# Patient Record
Sex: Female | Born: 1955 | Race: White | Hispanic: No | Marital: Married | Smoking: Never smoker
Health system: Southern US, Community
[De-identification: ages and names within clinical notes are randomized; demographics above are authoritative.]

## PROBLEM LIST (undated history)

## (undated) DIAGNOSIS — K8010 Calculus of gallbladder with chronic cholecystitis without obstruction: Secondary | ICD-10-CM

## (undated) DIAGNOSIS — M1990 Unspecified osteoarthritis, unspecified site: Secondary | ICD-10-CM

## (undated) DIAGNOSIS — Z973 Presence of spectacles and contact lenses: Secondary | ICD-10-CM

## (undated) DIAGNOSIS — F419 Anxiety disorder, unspecified: Secondary | ICD-10-CM

## (undated) DIAGNOSIS — K219 Gastro-esophageal reflux disease without esophagitis: Secondary | ICD-10-CM

## (undated) DIAGNOSIS — C801 Malignant (primary) neoplasm, unspecified: Secondary | ICD-10-CM

## (undated) DIAGNOSIS — E049 Nontoxic goiter, unspecified: Secondary | ICD-10-CM

## (undated) DIAGNOSIS — M810 Age-related osteoporosis without current pathological fracture: Secondary | ICD-10-CM

## (undated) DIAGNOSIS — E039 Hypothyroidism, unspecified: Secondary | ICD-10-CM

## (undated) HISTORY — PX: BREAST EXCISIONAL BIOPSY: SUR124

---
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
## 1983-09-21 HISTORY — PX: ABDOMINAL HYSTERECTOMY: SHX81

## 1983-09-21 HISTORY — PX: APPENDECTOMY: SHX54

## 1998-02-16 ENCOUNTER — Ambulatory Visit (HOSPITAL_COMMUNITY): Admission: RE | Admit: 1998-02-16 | Discharge: 1998-02-16 | Payer: Self-pay | Admitting: Family Medicine

## 1998-02-21 ENCOUNTER — Ambulatory Visit (HOSPITAL_COMMUNITY): Admission: RE | Admit: 1998-02-21 | Discharge: 1998-02-21 | Payer: Self-pay | Admitting: Family Medicine

## 1998-09-20 HISTORY — PX: OTHER SURGICAL HISTORY: SHX169

## 2000-08-29 ENCOUNTER — Encounter: Payer: Self-pay | Admitting: Family Medicine

## 2000-08-29 ENCOUNTER — Encounter: Admission: RE | Admit: 2000-08-29 | Discharge: 2000-08-29 | Payer: Self-pay | Admitting: Family Medicine

## 2001-02-24 ENCOUNTER — Encounter: Payer: Self-pay | Admitting: Family Medicine

## 2001-02-24 ENCOUNTER — Encounter: Admission: RE | Admit: 2001-02-24 | Discharge: 2001-02-24 | Payer: Self-pay

## 2001-08-10 ENCOUNTER — Other Ambulatory Visit: Admission: RE | Admit: 2001-08-10 | Discharge: 2001-08-10 | Payer: Self-pay | Admitting: Family Medicine

## 2001-08-31 ENCOUNTER — Encounter: Admission: RE | Admit: 2001-08-31 | Discharge: 2001-08-31 | Payer: Self-pay | Admitting: Family Medicine

## 2001-08-31 ENCOUNTER — Encounter: Payer: Self-pay | Admitting: Family Medicine

## 2002-03-12 ENCOUNTER — Other Ambulatory Visit: Admission: RE | Admit: 2002-03-12 | Discharge: 2002-03-12 | Payer: Self-pay | Admitting: Family Medicine

## 2002-03-12 ENCOUNTER — Encounter (INDEPENDENT_AMBULATORY_CARE_PROVIDER_SITE_OTHER): Payer: Self-pay | Admitting: Specialist

## 2002-03-12 ENCOUNTER — Encounter: Admission: RE | Admit: 2002-03-12 | Discharge: 2002-03-12 | Payer: Self-pay | Admitting: Family Medicine

## 2002-03-12 ENCOUNTER — Encounter: Payer: Self-pay | Admitting: Family Medicine

## 2002-03-15 HISTORY — PX: BREAST CYST ASPIRATION: SHX578

## 2002-08-03 ENCOUNTER — Encounter: Admission: RE | Admit: 2002-08-03 | Discharge: 2002-08-03 | Payer: Self-pay | Admitting: Family Medicine

## 2002-08-03 ENCOUNTER — Encounter: Payer: Self-pay | Admitting: Family Medicine

## 2003-08-29 ENCOUNTER — Encounter: Admission: RE | Admit: 2003-08-29 | Discharge: 2003-08-29 | Payer: Self-pay | Admitting: Family Medicine

## 2004-04-10 ENCOUNTER — Other Ambulatory Visit: Admission: RE | Admit: 2004-04-10 | Discharge: 2004-04-10 | Payer: Self-pay | Admitting: Family Medicine

## 2004-04-24 ENCOUNTER — Ambulatory Visit (HOSPITAL_COMMUNITY): Admission: RE | Admit: 2004-04-24 | Discharge: 2004-04-24 | Payer: Self-pay | Admitting: *Deleted

## 2004-08-31 ENCOUNTER — Encounter: Admission: RE | Admit: 2004-08-31 | Discharge: 2004-08-31 | Payer: Self-pay | Admitting: Family Medicine

## 2005-06-11 ENCOUNTER — Other Ambulatory Visit: Admission: RE | Admit: 2005-06-11 | Discharge: 2005-06-11 | Payer: Self-pay | Admitting: Otolaryngology

## 2005-06-16 ENCOUNTER — Encounter: Admission: RE | Admit: 2005-06-16 | Discharge: 2005-06-16 | Payer: Self-pay | Admitting: Otolaryngology

## 2005-10-04 ENCOUNTER — Encounter: Admission: RE | Admit: 2005-10-04 | Discharge: 2005-10-04 | Payer: Self-pay | Admitting: Family Medicine

## 2006-10-10 ENCOUNTER — Encounter: Admission: RE | Admit: 2006-10-10 | Discharge: 2006-10-10 | Payer: Self-pay | Admitting: Family Medicine

## 2007-11-24 ENCOUNTER — Encounter: Admission: RE | Admit: 2007-11-24 | Discharge: 2007-11-24 | Payer: Self-pay | Admitting: Family Medicine

## 2008-11-25 ENCOUNTER — Encounter: Admission: RE | Admit: 2008-11-25 | Discharge: 2008-11-25 | Payer: Self-pay | Admitting: Family Medicine

## 2009-11-28 ENCOUNTER — Encounter: Admission: RE | Admit: 2009-11-28 | Discharge: 2009-11-28 | Payer: Self-pay | Admitting: Family Medicine

## 2010-10-23 ENCOUNTER — Other Ambulatory Visit: Payer: Self-pay | Admitting: Family Medicine

## 2010-10-23 DIAGNOSIS — M8580 Other specified disorders of bone density and structure, unspecified site: Secondary | ICD-10-CM

## 2010-12-01 ENCOUNTER — Ambulatory Visit
Admission: RE | Admit: 2010-12-01 | Discharge: 2010-12-01 | Disposition: A | Discharge status: Home / Self Care | Payer: 59 | Source: Ambulatory Visit | Attending: Family Medicine | Admitting: Family Medicine

## 2010-12-01 DIAGNOSIS — M8580 Other specified disorders of bone density and structure, unspecified site: Secondary | ICD-10-CM

## 2011-11-19 ENCOUNTER — Other Ambulatory Visit: Payer: Self-pay | Admitting: Family Medicine

## 2011-11-19 DIAGNOSIS — Z1231 Encounter for screening mammogram for malignant neoplasm of breast: Secondary | ICD-10-CM

## 2011-12-16 ENCOUNTER — Ambulatory Visit
Admission: RE | Admit: 2011-12-16 | Discharge: 2011-12-16 | Disposition: A | Discharge status: Home / Self Care | Payer: 59 | Source: Ambulatory Visit | Attending: Family Medicine | Admitting: Family Medicine

## 2011-12-16 DIAGNOSIS — Z1231 Encounter for screening mammogram for malignant neoplasm of breast: Secondary | ICD-10-CM

## 2012-09-04 ENCOUNTER — Other Ambulatory Visit: Payer: Self-pay | Admitting: Family Medicine

## 2012-09-04 DIAGNOSIS — Z1231 Encounter for screening mammogram for malignant neoplasm of breast: Secondary | ICD-10-CM

## 2012-09-04 DIAGNOSIS — M8580 Other specified disorders of bone density and structure, unspecified site: Secondary | ICD-10-CM

## 2012-12-22 ENCOUNTER — Ambulatory Visit
Admission: RE | Admit: 2012-12-22 | Discharge: 2012-12-22 | Disposition: A | Discharge status: Home / Self Care | Payer: PRIVATE HEALTH INSURANCE | Source: Ambulatory Visit | Attending: Family Medicine | Admitting: Family Medicine

## 2012-12-22 DIAGNOSIS — Z1231 Encounter for screening mammogram for malignant neoplasm of breast: Secondary | ICD-10-CM

## 2012-12-22 DIAGNOSIS — M8580 Other specified disorders of bone density and structure, unspecified site: Secondary | ICD-10-CM

## 2013-11-21 ENCOUNTER — Other Ambulatory Visit: Payer: Self-pay

## 2013-11-21 DIAGNOSIS — Z1231 Encounter for screening mammogram for malignant neoplasm of breast: Secondary | ICD-10-CM

## 2013-12-28 ENCOUNTER — Ambulatory Visit
Admission: RE | Admit: 2013-12-28 | Discharge: 2013-12-28 | Disposition: A | Discharge status: Home / Self Care | Payer: PRIVATE HEALTH INSURANCE | Source: Ambulatory Visit

## 2013-12-28 DIAGNOSIS — Z1231 Encounter for screening mammogram for malignant neoplasm of breast: Secondary | ICD-10-CM

## 2014-09-25 ENCOUNTER — Other Ambulatory Visit: Payer: Self-pay | Admitting: Family Medicine

## 2014-09-25 DIAGNOSIS — M8580 Other specified disorders of bone density and structure, unspecified site: Secondary | ICD-10-CM

## 2014-10-02 ENCOUNTER — Other Ambulatory Visit: Payer: Self-pay

## 2014-10-02 DIAGNOSIS — Z1231 Encounter for screening mammogram for malignant neoplasm of breast: Secondary | ICD-10-CM

## 2014-12-31 ENCOUNTER — Ambulatory Visit
Admission: RE | Admit: 2014-12-31 | Discharge: 2014-12-31 | Disposition: A | Discharge status: Home / Self Care | Payer: BLUE CROSS/BLUE SHIELD | Source: Ambulatory Visit

## 2014-12-31 ENCOUNTER — Ambulatory Visit
Admission: RE | Admit: 2014-12-31 | Discharge: 2014-12-31 | Disposition: A | Discharge status: Home / Self Care | Payer: BLUE CROSS/BLUE SHIELD | Source: Ambulatory Visit | Attending: Family Medicine | Admitting: Family Medicine

## 2014-12-31 DIAGNOSIS — M8580 Other specified disorders of bone density and structure, unspecified site: Secondary | ICD-10-CM

## 2014-12-31 DIAGNOSIS — Z1231 Encounter for screening mammogram for malignant neoplasm of breast: Secondary | ICD-10-CM

## 2015-06-25 ENCOUNTER — Ambulatory Visit: Payer: BLUE CROSS/BLUE SHIELD | Admitting: Physical Therapy

## 2016-01-05 ENCOUNTER — Other Ambulatory Visit: Payer: Self-pay

## 2016-01-05 DIAGNOSIS — Z1231 Encounter for screening mammogram for malignant neoplasm of breast: Secondary | ICD-10-CM

## 2016-01-20 ENCOUNTER — Ambulatory Visit
Admission: RE | Admit: 2016-01-20 | Discharge: 2016-01-20 | Disposition: A | Discharge status: Home / Self Care | Payer: BLUE CROSS/BLUE SHIELD | Source: Ambulatory Visit

## 2016-01-20 DIAGNOSIS — Z1231 Encounter for screening mammogram for malignant neoplasm of breast: Secondary | ICD-10-CM

## 2016-10-21 ENCOUNTER — Other Ambulatory Visit: Payer: Self-pay | Admitting: Family Medicine

## 2016-10-21 DIAGNOSIS — M8580 Other specified disorders of bone density and structure, unspecified site: Secondary | ICD-10-CM

## 2016-11-03 ENCOUNTER — Other Ambulatory Visit: Payer: Self-pay | Admitting: Family Medicine

## 2016-11-03 DIAGNOSIS — Z1231 Encounter for screening mammogram for malignant neoplasm of breast: Secondary | ICD-10-CM

## 2017-01-20 ENCOUNTER — Ambulatory Visit
Admission: RE | Admit: 2017-01-20 | Discharge: 2017-01-20 | Disposition: A | Discharge status: Home / Self Care | Payer: PRIVATE HEALTH INSURANCE | Source: Ambulatory Visit | Attending: Family Medicine | Admitting: Family Medicine

## 2017-01-20 ENCOUNTER — Ambulatory Visit
Admission: RE | Admit: 2017-01-20 | Discharge: 2017-01-20 | Disposition: A | Discharge status: Home / Self Care | Payer: BLUE CROSS/BLUE SHIELD | Source: Ambulatory Visit | Attending: Family Medicine | Admitting: Family Medicine

## 2017-01-20 DIAGNOSIS — M8580 Other specified disorders of bone density and structure, unspecified site: Secondary | ICD-10-CM

## 2017-01-20 DIAGNOSIS — Z1231 Encounter for screening mammogram for malignant neoplasm of breast: Secondary | ICD-10-CM

## 2018-01-16 ENCOUNTER — Other Ambulatory Visit: Payer: Self-pay | Admitting: Family Medicine

## 2018-01-16 DIAGNOSIS — Z1231 Encounter for screening mammogram for malignant neoplasm of breast: Secondary | ICD-10-CM

## 2018-02-14 ENCOUNTER — Ambulatory Visit
Admission: RE | Admit: 2018-02-14 | Discharge: 2018-02-14 | Disposition: A | Discharge status: Home / Self Care | Payer: PRIVATE HEALTH INSURANCE | Source: Ambulatory Visit | Attending: Family Medicine | Admitting: Family Medicine

## 2018-02-14 DIAGNOSIS — Z1231 Encounter for screening mammogram for malignant neoplasm of breast: Secondary | ICD-10-CM

## 2018-10-27 DIAGNOSIS — H6990 Unspecified Eustachian tube disorder, unspecified ear: Secondary | ICD-10-CM | POA: Diagnosis not present

## 2018-10-27 DIAGNOSIS — Z131 Encounter for screening for diabetes mellitus: Secondary | ICD-10-CM | POA: Diagnosis not present

## 2018-10-27 DIAGNOSIS — M79646 Pain in unspecified finger(s): Secondary | ICD-10-CM | POA: Diagnosis not present

## 2018-10-27 DIAGNOSIS — Z0000 Encounter for general adult medical examination without abnormal findings: Secondary | ICD-10-CM | POA: Diagnosis not present

## 2018-10-27 DIAGNOSIS — Z1211 Encounter for screening for malignant neoplasm of colon: Secondary | ICD-10-CM | POA: Diagnosis not present

## 2018-10-27 DIAGNOSIS — D485 Neoplasm of uncertain behavior of skin: Secondary | ICD-10-CM | POA: Diagnosis not present

## 2018-10-27 DIAGNOSIS — M810 Age-related osteoporosis without current pathological fracture: Secondary | ICD-10-CM | POA: Diagnosis not present

## 2018-10-27 DIAGNOSIS — F3340 Major depressive disorder, recurrent, in remission, unspecified: Secondary | ICD-10-CM | POA: Diagnosis not present

## 2018-10-27 DIAGNOSIS — K449 Diaphragmatic hernia without obstruction or gangrene: Secondary | ICD-10-CM | POA: Diagnosis not present

## 2018-10-27 DIAGNOSIS — E039 Hypothyroidism, unspecified: Secondary | ICD-10-CM | POA: Diagnosis not present

## 2018-11-03 DIAGNOSIS — Z1211 Encounter for screening for malignant neoplasm of colon: Secondary | ICD-10-CM | POA: Diagnosis not present

## 2018-11-03 DIAGNOSIS — M810 Age-related osteoporosis without current pathological fracture: Secondary | ICD-10-CM | POA: Diagnosis not present

## 2018-11-03 DIAGNOSIS — E039 Hypothyroidism, unspecified: Secondary | ICD-10-CM | POA: Diagnosis not present

## 2018-11-03 DIAGNOSIS — K219 Gastro-esophageal reflux disease without esophagitis: Secondary | ICD-10-CM | POA: Diagnosis not present

## 2018-11-03 DIAGNOSIS — Z0000 Encounter for general adult medical examination without abnormal findings: Secondary | ICD-10-CM | POA: Diagnosis not present

## 2018-11-03 DIAGNOSIS — E782 Mixed hyperlipidemia: Secondary | ICD-10-CM | POA: Diagnosis not present

## 2018-11-03 DIAGNOSIS — Z23 Encounter for immunization: Secondary | ICD-10-CM | POA: Diagnosis not present

## 2018-11-03 DIAGNOSIS — Z7189 Other specified counseling: Secondary | ICD-10-CM | POA: Diagnosis not present

## 2018-11-03 DIAGNOSIS — Z01419 Encounter for gynecological examination (general) (routine) without abnormal findings: Secondary | ICD-10-CM | POA: Diagnosis not present

## 2018-11-03 DIAGNOSIS — J309 Allergic rhinitis, unspecified: Secondary | ICD-10-CM | POA: Diagnosis not present

## 2018-11-03 DIAGNOSIS — F3340 Major depressive disorder, recurrent, in remission, unspecified: Secondary | ICD-10-CM | POA: Diagnosis not present

## 2018-11-10 ENCOUNTER — Other Ambulatory Visit: Payer: Self-pay | Admitting: Family Medicine

## 2018-11-10 DIAGNOSIS — M810 Age-related osteoporosis without current pathological fracture: Secondary | ICD-10-CM

## 2018-11-16 ENCOUNTER — Other Ambulatory Visit: Payer: Self-pay | Admitting: Family Medicine

## 2018-11-16 DIAGNOSIS — Z1231 Encounter for screening mammogram for malignant neoplasm of breast: Secondary | ICD-10-CM

## 2018-11-30 DIAGNOSIS — Z85828 Personal history of other malignant neoplasm of skin: Secondary | ICD-10-CM | POA: Diagnosis not present

## 2018-11-30 DIAGNOSIS — L910 Hypertrophic scar: Secondary | ICD-10-CM | POA: Diagnosis not present

## 2019-02-16 ENCOUNTER — Other Ambulatory Visit: Payer: PRIVATE HEALTH INSURANCE

## 2019-02-16 ENCOUNTER — Ambulatory Visit: Payer: PRIVATE HEALTH INSURANCE

## 2019-02-22 DIAGNOSIS — M810 Age-related osteoporosis without current pathological fracture: Secondary | ICD-10-CM | POA: Diagnosis not present

## 2019-03-21 HISTORY — PX: OTHER SURGICAL HISTORY: SHX169

## 2019-04-06 ENCOUNTER — Ambulatory Visit: Payer: PRIVATE HEALTH INSURANCE

## 2019-04-06 ENCOUNTER — Other Ambulatory Visit: Payer: PRIVATE HEALTH INSURANCE

## 2019-04-10 DIAGNOSIS — W541XXA Struck by dog, initial encounter: Secondary | ICD-10-CM | POA: Diagnosis not present

## 2019-04-10 DIAGNOSIS — S52541A Smith's fracture of right radius, initial encounter for closed fracture: Secondary | ICD-10-CM | POA: Diagnosis not present

## 2019-04-11 DIAGNOSIS — E039 Hypothyroidism, unspecified: Secondary | ICD-10-CM | POA: Diagnosis not present

## 2019-04-11 DIAGNOSIS — S52571A Other intraarticular fracture of lower end of right radius, initial encounter for closed fracture: Secondary | ICD-10-CM | POA: Diagnosis not present

## 2019-04-11 DIAGNOSIS — I509 Heart failure, unspecified: Secondary | ICD-10-CM | POA: Diagnosis not present

## 2019-04-11 DIAGNOSIS — M810 Age-related osteoporosis without current pathological fracture: Secondary | ICD-10-CM | POA: Diagnosis not present

## 2019-04-11 DIAGNOSIS — F329 Major depressive disorder, single episode, unspecified: Secondary | ICD-10-CM | POA: Diagnosis not present

## 2019-04-11 DIAGNOSIS — Z79899 Other long term (current) drug therapy: Secondary | ICD-10-CM | POA: Diagnosis not present

## 2019-04-11 DIAGNOSIS — F419 Anxiety disorder, unspecified: Secondary | ICD-10-CM | POA: Diagnosis not present

## 2019-04-11 DIAGNOSIS — G8918 Other acute postprocedural pain: Secondary | ICD-10-CM | POA: Diagnosis not present

## 2019-05-24 DIAGNOSIS — S52571D Other intraarticular fracture of lower end of right radius, subsequent encounter for closed fracture with routine healing: Secondary | ICD-10-CM | POA: Diagnosis not present

## 2019-06-04 ENCOUNTER — Other Ambulatory Visit: Payer: Self-pay

## 2019-06-04 ENCOUNTER — Ambulatory Visit: Payer: Medicare Other | Admitting: Physical Therapy

## 2019-06-04 ENCOUNTER — Encounter (HOSPITAL_COMMUNITY): Payer: Self-pay

## 2019-06-04 ENCOUNTER — Ambulatory Visit (HOSPITAL_COMMUNITY): Payer: Medicare Other | Attending: Family Medicine

## 2019-06-04 DIAGNOSIS — R278 Other lack of coordination: Secondary | ICD-10-CM | POA: Diagnosis not present

## 2019-06-04 DIAGNOSIS — M25531 Pain in right wrist: Secondary | ICD-10-CM | POA: Diagnosis not present

## 2019-06-04 DIAGNOSIS — R29898 Other symptoms and signs involving the musculoskeletal system: Secondary | ICD-10-CM

## 2019-06-04 DIAGNOSIS — R600 Localized edema: Secondary | ICD-10-CM | POA: Diagnosis not present

## 2019-06-04 DIAGNOSIS — M25631 Stiffness of right wrist, not elsewhere classified: Secondary | ICD-10-CM

## 2019-06-04 NOTE — Therapy (Signed)
[place], [place], [address] Phone: [phone]   Fax:  [phone]  Occupational Therapy Evaluation  Patient Details  Name: Madeline Horton MRN: [id number] Date of Birth: 01-03-1956 Referring Provider (OT): Les Pou, DO   Encounter Date: 06/04/2019  OT End of Session - 06/04/19 [id number]    Visit Number  1    Number of Visits  12    Date for OT Re-Evaluation  07/16/19    Authorization Type  1) [employer] 2) Eulis Foster    Authorization Time Period  progress note at visit 10.    Authorization - Visit Number  1    Authorization - Number of Visits  10    OT Start Time  [id number]    OT Stop Time  1620    OT Time Calculation (min)  48 min    Activity Tolerance  Patient tolerated treatment well    Behavior During Therapy  WFL for tasks assessed/performed       History reviewed. No pertinent past medical history.  Past Surgical History:  Procedure Laterality Date  . BREAST EXCISIONAL BIOPSY Left    benign    There were no vitals filed for this visit.  Subjective Assessment - 06/04/19 1540    Subjective   S: I can't do much of anything with this hand.    Pertinent History  Patient is a 63 y/o female S/P right wrist ORIF which was completed on 04/11/19 after sustaining a mechanical fall on 04/09/19. Patient reports that she was placed in a full cast for 2 weeks and then was placed in a short arm cast. Her cast was removed on 05/24/19. Dr. Roberto Scales has referred patient to occupational therapy for evaluation and treatment.    Special Tests  DASH    Patient Stated Goals  To be able to use my hand again.    Currently in Pain?  Yes    Pain Score  5     Pain Location  Wrist    Pain Orientation  Right    Pain Descriptors / Indicators  Burning;Throbbing    Pain Type  Acute pain    Pain Radiating Towards  Sometimes it moves up to the neck/shoulder    Pain Onset  More than a month ago    Pain Frequency   Intermittent    Aggravating Factors   movement and use    Pain Relieving Factors  Advil, simple gentle exercises, ice and heat    Effect of Pain on Daily Activities  severe effect        [facility] OT Assessment - 06/04/19 1542      Assessment   Medical Diagnosis  Right ORIF Wrist    Referring Provider (OT)  Les Pou, DO    Onset Date/Surgical Date  04/09/19   sugery: 04/11/19   Hand Dominance  Right    Next MD Visit  As needed.    Prior Therapy  None for this condition      Precautions   Precautions  Other (comment)    Precaution Comments  Progress as tolerated.       Restrictions   Weight Bearing Restrictions  No      Balance Screen   Has the patient fallen in the past 6 months  Yes    How many times?  1    Has the patient had a decrease in activity level because of a fear of falling?  No    Is the patient reluctant to leave their home because of a fear of falling?   No      Home  Environment   Family/patient expects to be discharged to:  Private residence      Prior Function   Level of [place]  Retired    Leisure  patient enjoys to sew.      ADL   ADL comments  Difficulty with any type of activity with the right hand. Patient reports she is able to use a fork now although it is difficult.       Mobility   Mobility Status  Independent      Written Expression   Dominant Hand  Right      Vision - History   Baseline Vision  Wears glasses all the time      Cognition   Overall Cognitive Status  Within Functional Limits for tasks assessed      Sensation   Stereognosis  Appears Intact    Hot/Cold  Appears Intact      Coordination   9 Hole Peg Test  Left;Right    Right 9 Hole Peg Test  24.9"    Left 9 Hole Peg Test  24.3"      Edema   Edema  Right wrist: 19 cm Left wrist: 17 cm. Right MCP joints: 19.5 cm Left MCP joints: 17.75 cm      ROM / Strength   AROM / PROM / Strength  AROM;PROM;Strength      Palpation   Palpation  comment  MIn fascial restriction along right wrist and forearm.       AROM   Overall AROM Comments  Assessed seated    AROM Assessment Site  Forearm;Wrist    Right/Left Forearm  Right    Right Forearm Pronation  90 Degrees    Right Forearm Supination  90 Degrees    Right/Left Wrist  Right    Right Wrist Extension  30 Degrees    Right Wrist Flexion  34 Degrees    Right Wrist Radial Deviation  20 Degrees    Right Wrist Ulnar Deviation  12 Degrees      PROM   PROM Assessment Site  Forearm;Wrist    Right/Left Forearm  Right    Right/Left Wrist  Right    Right Wrist Extension  48 Degrees    Right Wrist Flexion  40 Degrees    Right Wrist Radial Deviation  26 Degrees    Right Wrist Ulnar Deviation  20 Degrees      Strength   Strength Assessment Site  Wrist;Hand    Right/Left Wrist  Right    Right Wrist Flexion  3-/5    Right Wrist Extension  3-/5    Right Wrist Radial Deviation  3-/5    Right Wrist Ulnar Deviation  3-/5    Right/Left hand  Right;Left    Right Hand Grip (lbs)  6    Right Hand Lateral Pinch  16 lbs    Right Hand 3 Point Pinch  5 lbs    Left Hand Grip (lbs)  60    Left Hand Lateral Pinch  10 lbs    Left Hand 3 Point Pinch  11 lbs      Right Hand AROM   R Thumb MCP 0-60  26 Degrees    R Thumb IP 0-80  40 Degrees    R Index  MCP 0-90  50  Degrees    R Index PIP 0-100  82 Degrees    R Index DIP 0-70  42 Degrees    R Long  MCP 0-90  48 Degrees    R Long PIP 0-100  78 Degrees    R Long DIP 0-70  48 Degrees    R Ring  MCP 0-90  42 Degrees    R Ring PIP 0-100  80 Degrees    R Ring DIP 0-70  54 Degrees    R Little  MCP 0-90  0 Degrees    R Little PIP 0-100  50 Degrees    R Little DIP 0-70  48 Degrees          Quick Dash - 06/04/19 1547    Open a tight or new jar  Unable    Do heavy household chores (wash walls, wash floors)  Unable    Carry a shopping bag or briefcase  Unable    Wash your back  Unable    Use a knife to cut food  Unable    Recreational  activities in which you take some force or impact through your arm, shoulder, or hand (golf, hammering, tennis)  Unable    During the past week, to what extent has your arm, shoulder or hand problem interfered with your normal social activities with family, friends, neighbors, or groups?  Not at all    During the past week, to what extent has your arm, shoulder or hand problem limited your work or other regular daily activities  Extremely    Arm, shoulder, or hand pain.  Extreme    Tingling (pins and needles) in your arm, shoulder, or hand  Extreme    Difficulty Sleeping  Moderate difficulty    DASH Score  86.36 %                 OT Education - 06/04/19 1646    Education Details  wrist stretches, tendon gliding, towel squeeze and washcloth crumpling. Pt provided with right hand edema glove. Discussed wear schedule and cleaning.    Person(s) Educated  Patient    Methods  Explanation;Demonstration;Verbal cues;Handout    Comprehension  Verbalized understanding       OT Short Term Goals - 06/04/19 1651      OT SHORT TERM GOAL #1   Title  Patient will be educated and independent with HEP in order to faciliate her progress in therapy and allow her to return to using her RUE for her daily and leisure tasks.    Time  3    Period  Weeks    Status  New    Target Date  06/25/19      OT SHORT TERM GOAL #2   Title  Patient will demonstrate P/ROM WFL in order to begin using her Right hand as her dominant hand for 50% of daily tasks.    Time  3    Period  Weeks    Status  New        OT Long Term Goals - 06/04/19 1652      OT LONG TERM GOAL #1   Title  Patient will reach her highest level of independence with all daily and leisure tasks while using her RUE as her dominant extremity for 75% or more daily tasks.    Time  6    Period  Weeks    Status  New    Target Date  07/16/19  OT LONG TERM GOAL #2   Title  Pt will increase her Right wrist and hand A/ROM to [facility] in order to  return to her leisure tasks such as sewing.    Time  6    Period  Weeks      OT LONG TERM GOAL #3   Title  Patient will increase her right grip strength by 15# and her pinch strength by 8# in order to hold onto small items without dropping them.    Time  6    Period  Weeks    Status  New      OT LONG TERM GOAL #4   Title  Patient will increase her right wrist strength to 4/5 in order to complete basic daily tasks such as grooming, dressing, and bathing with decreased difficulty.    Time  6    Period  Weeks    Status  New      OT LONG TERM GOAL #5   Title  patient will increase her right hand coordination by completing the 9 hole peg test in 22 seconds or less.    Time  6    Period  Weeks    Status  New            Plan - 06/04/19 1648    Clinical Impression Statement  A: Patient is a5 y/o female S/P right wrist ORIF causing increased pain, fascial retrcitions, and decreased ROM, strength, and coordination resulting in difficulty completing any task while using her right hand as her dominant extremity.    OT Occupational Profile and History  Detailed Assessment- Review of Records and additional review of physical, cognitive, psychosocial history related to current functional performance    Occupational performance deficits (Please refer to evaluation for details):  ADL's;IADL's;Leisure    Body Structure / Function / Physical Skills  ADL;Dexterity;ROM;IADL;Sensation;Mobility;Fascial restriction;Coordination;GMC;UE functional use;Pain;FMC;Strength;Flexibility    Rehab Potential  Excellent    Clinical Decision Making  Several treatment options, min-mod task modification necessary    Comorbidities Affecting Occupational Performance:  None    Modification or Assistance to Complete Evaluation   Min-Moderate modification of tasks or assist with assess necessary to complete eval    OT Frequency  2x / week    OT Duration  6 weeks    OT Treatment/Interventions  Self-care/ADL  training;Therapeutic exercise;Splinting;Manual Therapy;Neuromuscular education;Ultrasound;Cryotherapy;Electrical Stimulation;Moist Heat;Passive range of motion;Contrast Bath;Patient/family education;Paraffin;DME and/or AE instruction;Therapeutic activities    Plan  P: patient will benefit from skilled OT services to increase functional performance and allow her to return to using her RUE as her dominant extremity. Treatment Plan: myofascial release, manual stretching, P/ROM, A/ROM, grip and pinch strengthening, edema management, wrist strengthening    Consulted and Agree with Plan of Care  Patient       Patient will benefit from skilled therapeutic intervention in order to improve the following deficits and impairments:   Body Structure / Function / Physical Skills: ADL, Dexterity, ROM, IADL, Sensation, Mobility, Fascial restriction, Coordination, GMC, UE functional use, Pain, FMC, Strength, Flexibility       Visit Diagnosis: Pain in right wrist  Stiffness of right wrist joint  Other symptoms and signs involving the musculoskeletal system  Localized edema  Other lack of coordination    Problem List There are no active problems to display for this patient.  Ailene Ravel, OTR/L,CBIS  [phone]  06/04/2019, 5:02 PM  [facility] [address] [place], [place], [address] Phone: [phone]  Fax:  [phone]  Name: RASHEENA TRETTIN MRN: [id number] Date of Birth: Apr 25, 1956

## 2019-06-04 NOTE — Patient Instructions (Signed)
Complete the following 3-5 times a day.   Wrist Flexion Stretch   With arm of affected side extended in front of body with palm facing down, let affected wrist drop downward. Using hand on unaffected side, apply gentle downward push on affected hand, stretching the affected wrist downward. Hold this position for 10-15 seconds then relax wrist. Complete 2 times.   Wrist Extensor Stretch. Hold for 10-15 seconds. Complete 2 times.      Tendon Gliding Exercises: Complete 10X, hold for 3-5 seconds, 1-2x per day  1) Straight: begin with wrist in extended position and fingers straight      2) Hook: Bend your fingers making them look like a hook while keeping your thumb straight.      3) Fist: Make your hand into a fist.      4) Table Top: Straighten your fingers straight out making them look like a table top.      5) Straight Fist: Bend your fingers straight down into a straight fist.     Paper Crumpling Exercise   Begin with right palm down on piece of paper. Maintaining contact between surface and heel of hand, crumple paper into a ball. Repeat _5-10___ times per set. Do ____ sets per session. Do ____ sessions per day.  Copyright  VHI. All rights reserved.     Towel Roll Squeeze   With right forearm resting on surface, gently squeeze towel. Repeat _5-10___ times per set. Do ____ sets per session. Do ____ sessions per day.  Copyright  VHI. All rights reserved.   Abduction / Adduction (Active)    With hand flat on table, spread all fingers apart, then bring them together as close as possible. Repeat _10___ times. Do ____ sessions per day.  Copyright  VHI. All rights reserved.  AROM: Thumb Abduction / Adduction   Actively pull right thumb away from palm as far as possible. Hold ____ seconds. Then bring thumb back to touch fingers. Try not to bend fingers toward thumb. Repeat _10___ times per set. Do ____ sets per session. Do ____ sessions per day.   Copyright  VHI. All rights reserved.

## 2019-06-05 ENCOUNTER — Ambulatory Visit
Admission: RE | Admit: 2019-06-05 | Discharge: 2019-06-05 | Disposition: A | Discharge status: Home / Self Care | Payer: Medicare Other | Source: Ambulatory Visit | Attending: Family Medicine | Admitting: Family Medicine

## 2019-06-05 DIAGNOSIS — Z1231 Encounter for screening mammogram for malignant neoplasm of breast: Secondary | ICD-10-CM | POA: Diagnosis not present

## 2019-06-05 DIAGNOSIS — M8589 Other specified disorders of bone density and structure, multiple sites: Secondary | ICD-10-CM | POA: Diagnosis not present

## 2019-06-05 DIAGNOSIS — M810 Age-related osteoporosis without current pathological fracture: Secondary | ICD-10-CM

## 2019-06-05 DIAGNOSIS — Z780 Asymptomatic menopausal state: Secondary | ICD-10-CM | POA: Diagnosis not present

## 2019-06-06 ENCOUNTER — Ambulatory Visit (HOSPITAL_COMMUNITY): Payer: Medicare Other

## 2019-06-06 ENCOUNTER — Encounter (HOSPITAL_COMMUNITY): Payer: Self-pay

## 2019-06-06 ENCOUNTER — Other Ambulatory Visit: Payer: Self-pay

## 2019-06-06 DIAGNOSIS — R29898 Other symptoms and signs involving the musculoskeletal system: Secondary | ICD-10-CM

## 2019-06-06 DIAGNOSIS — R278 Other lack of coordination: Secondary | ICD-10-CM

## 2019-06-06 DIAGNOSIS — M25631 Stiffness of right wrist, not elsewhere classified: Secondary | ICD-10-CM | POA: Diagnosis not present

## 2019-06-06 DIAGNOSIS — M25531 Pain in right wrist: Secondary | ICD-10-CM

## 2019-06-06 DIAGNOSIS — R600 Localized edema: Secondary | ICD-10-CM

## 2019-06-07 NOTE — Addendum Note (Signed)
Addended by: Ailene Ravel D on: 06/07/2019 10:09 AM   Modules accepted: Orders

## 2019-06-07 NOTE — Therapy (Signed)
[place], [place], [address] Phone: [phone]   Fax:  [phone]  Occupational Therapy Treatment  Patient Details  Name: Madeline Horton MRN: [id number] Date of Birth: 1956-05-03 Referring Provider (OT): Les Pou, DO   Encounter Date: 06/06/2019  OT End of Session - 06/07/19 0915    Visit Number  2    Number of Visits  12    Date for OT Re-Evaluation  07/16/19    Authorization Type  1) [employer] 2) [employer]ul[employer]    Authorization Time Period  progress note at visit 10.    Authorization - Visit Number  2    Authorization - Number of Visits  10    OT Start Time  1602    OT Stop Time  1638    OT Time Calculation (min)  36 min    Activity Tolerance  Patient tolerated treatment well    Behavior During Therapy  WFL for tasks assessed/performed       History reviewed. No pertinent past medical history.  Past Surgical History:  Procedure Laterality Date  . BREAST EXCISIONAL BIOPSY Left    benign    There were no vitals filed for this visit.  Subjective Assessment - 06/06/19 1609    Subjective   S: it's sore today    Currently in Pain?  Yes    Pain Score  7     Pain Location  Wrist   right small digit   Pain Orientation  Right    Pain Descriptors / Indicators  Sore    Pain Type  Acute pain         [facility] OT Assessment - 06/06/19 1611      Assessment   Medical Diagnosis  Right ORIF Wrist      Precautions   Precautions  Other (comment)    Precaution Comments  Progress as tolerated.                OT Treatments/Exercises (OP) - 06/06/19 1611      Exercises   Exercises  Wrist;Hand;Theraputty      Wrist Exercises   Wrist Flexion  PROM;AROM;10 reps    Wrist Extension  PROM;AROM;10 reps    Wrist Radial Deviation  PROM;5 reps    Wrist Ulnar Deviation  PROM;5 reps    Other wrist exercises  Pt utilized red resistive clothespin to and 3 point pinch to pick up 20  sponges and place in container.       Additional Wrist Exercises   Sponges  18, 23      Hand Exercises   MCPJ Flexion  PROM;AROM;10 reps    PIPJ Flexion  PROM;AROM;5 reps;10 reps    DIPJ Flexion  PROM;AROM;10 reps      Theraputty   Theraputty - Flatten  yellow- standing      Modalities   Modalities  Moist Heat;Paraffin      Moist Heat Therapy   Number Minutes Moist Heat  5 Minutes    Moist Heat Location  Wrist;Hand      RUE Paraffin   Number Minutes Paraffin  5 Minutes    RUE Paraffin Location  Wrist   and hand            OT Education - 06/07/19 0915    Education Details  reviewed therapy goals    Person(s) Educated  Patient    Methods  Explanation    Comprehension  Verbalized understanding  OT Short Term Goals - 06/06/19 1609      OT SHORT TERM GOAL #1   Title  Patient will be educated and independent with HEP in order to faciliate her progress in therapy and allow her to return to using her RUE for her daily and leisure tasks.    Time  3    Period  Weeks    Status  On-going    Target Date  06/25/19      OT SHORT TERM GOAL #2   Title  Patient will demonstrate P/ROM WFL in order to begin using her Right hand as her dominant hand for 50% of daily tasks.    Time  3    Period  Weeks    Status  On-going        OT Long Term Goals - 06/06/19 1609      OT LONG TERM GOAL #1   Title  Patient will reach her highest level of independence with all daily and leisure tasks while using her RUE as her dominant extremity for 75% or more daily tasks.    Time  6    Period  Weeks    Status  On-going      OT LONG TERM GOAL #2   Title  Pt will increase her Right wrist and hand A/ROM to [facility] in order to return to her leisure tasks such as sewing.    Time  6    Period  Weeks    Status  On-going      OT LONG TERM GOAL #3   Title  Patient will increase her right grip strength by 15# and her pinch strength by 8# in order to hold onto small items without dropping  them.    Time  6    Period  Weeks    Status  On-going      OT LONG TERM GOAL #4   Title  Patient will increase her right wrist strength to 4/5 in order to complete basic daily tasks such as grooming, dressing, and bathing with decreased difficulty.    Time  6    Period  Weeks    Status  On-going      OT LONG TERM GOAL #5   Title  patient will increase her right hand coordination by completing the 9 hole peg test in 22 seconds or less.    Time  6    Period  Weeks    Status  On-going            Plan - 06/07/19 0937    Clinical Impression Statement  A: Initiated myofascial release, manual stretching and ROM and strengthening of the right wrist. Utilized parraffin wax at start of session to increase joint mobility and ROM. Patient demonstrates more ROM restrictions during flexion versus extension. Manual techniques were completed to the wrist and forearm. VC for form and technique were completed.    Body Structure / Function / Physical Skills  ADL;Dexterity;ROM;IADL;Sensation;Mobility;Fascial restriction;Coordination;GMC;UE functional use;Pain;FMC;Strength;Flexibility    Plan  P: Use yellow putty for grip and pinch. Provide for HEP.    Consulted and Agree with Plan of Care  Patient       Patient will benefit from skilled therapeutic intervention in order to improve the following deficits and impairments:   Body Structure / Function / Physical Skills: ADL, Dexterity, ROM, IADL, Sensation, Mobility, Fascial restriction, Coordination, GMC, UE functional use, Pain, FMC, Strength, Flexibility       Visit Diagnosis: Stiffness of  right wrist joint  Other symptoms and signs involving the musculoskeletal system  Pain in right wrist  Localized edema  Other lack of coordination    Problem List There are no active problems to display for this patient.  Ailene Ravel, OTR/L,CBIS  [phone]  06/07/2019, 10:06 AM  [facility] [address] [place], [place], [address] Phone: [phone]   Fax:  [phone]  Name: ZAKYRAH REUSS MRN: [id number] Date of Birth: 01-17-1956

## 2019-06-11 ENCOUNTER — Encounter (HOSPITAL_COMMUNITY): Payer: Self-pay

## 2019-06-11 ENCOUNTER — Ambulatory Visit (HOSPITAL_COMMUNITY): Payer: Medicare Other

## 2019-06-11 ENCOUNTER — Other Ambulatory Visit: Payer: Self-pay

## 2019-06-11 DIAGNOSIS — R600 Localized edema: Secondary | ICD-10-CM

## 2019-06-11 DIAGNOSIS — R29898 Other symptoms and signs involving the musculoskeletal system: Secondary | ICD-10-CM | POA: Diagnosis not present

## 2019-06-11 DIAGNOSIS — M25631 Stiffness of right wrist, not elsewhere classified: Secondary | ICD-10-CM

## 2019-06-11 DIAGNOSIS — R278 Other lack of coordination: Secondary | ICD-10-CM | POA: Diagnosis not present

## 2019-06-11 DIAGNOSIS — M25531 Pain in right wrist: Secondary | ICD-10-CM

## 2019-06-11 NOTE — Therapy (Signed)
[facility] [place], [place], [address] Phone: [phone]   Fax:  [phone]  Occupational Therapy Treatment  Patient Details  Name: Madeline Horton MRN: [id number] Date of Birth: 18-Feb-1956 Referring Provider (OT): Les Pou, DO   Encounter Date: 06/11/2019  OT End of Session - 06/11/19 1149    Visit Number  3    Number of Visits  12    Date for OT Re-Evaluation  07/16/19    Authorization Type  1) [employer] 2) Eulis Foster    Authorization Time Period  progress note at visit 10.    Authorization - Visit Number  3    Authorization - Number of Visits  10    OT Start Time  [id number]    OT Stop Time  1153    OT Time Calculation (min)  38 min    Activity Tolerance  Patient tolerated treatment well    Behavior During Therapy  WFL for tasks assessed/performed       History reviewed. No pertinent past medical history.  Past Surgical History:  Procedure Laterality Date  . BREAST EXCISIONAL BIOPSY Left    benign    There were no vitals filed for this visit.  Subjective Assessment - 06/11/19 1145    Subjective   S: it feels sore in my wrist and small finger.    Currently in Pain?  Yes    Pain Score  2     Pain Location  Wrist   small finger   Pain Orientation  Right    Pain Descriptors / Indicators  Sore    Pain Type  Acute pain    Pain Radiating Towards  up neck/shoulder    Pain Onset  More than a month ago    Pain Frequency  Intermittent    Aggravating Factors   movement and use    Pain Relieving Factors  Advil, simple gentle exercises, ice and heat    Effect of Pain on Daily Activities  max effect    Multiple Pain Sites  No         [facility] OT Assessment - 06/11/19 1128      Assessment   Medical Diagnosis  Right ORIF Wrist      Precautions   Precautions  Other (comment)    Precaution Comments  Progress as tolerated.                OT Treatments/Exercises (OP) - 06/11/19 1128       Exercises   Exercises  Wrist;Hand;Theraputty      Wrist Exercises   Wrist Flexion  PROM;10 reps    Wrist Extension  PROM;10 reps    Wrist Radial Deviation  PROM;5 reps    Wrist Ulnar Deviation  PROM;5 reps      Additional Wrist Exercises   Sponges  20      Hand Exercises   MCPJ Flexion  PROM;AROM;10 reps    PIPJ Flexion  PROM;AROM;5 reps;10 reps    DIPJ Flexion  PROM;AROM;10 reps      Theraputty   Theraputty - Flatten  yellow- standing and standing    Theraputty - Roll  yellow    Theraputty - Grip  yellow     Theraputty - Pinch  yellow - lateral and 3 point pinch      Modalities   Modalities  Moist Heat;Paraffin      Moist Heat Therapy   Number Minutes Moist Heat  5 Minutes  Moist Heat Location  Wrist;Hand      RUE Paraffin   Number Minutes Paraffin  5 Minutes    RUE Paraffin Location  Wrist   and small finger     Manual Therapy   Manual Therapy  Myofascial release    Manual therapy comments  Manual techniques completed prior to exercises    Myofascial Release  Myofascial release and manual stretching completed to right wrist and hand to decrease fascial restrictions and increase joint mobility in a pain free zone.              OT Education - 06/11/19 1145    Education Details  yellow theraputty with HEP for grip and pinch    Person(s) Educated  Patient    Methods  Explanation;Verbal cues;Handout    Comprehension  Returned demonstration;Verbalized understanding       OT Short Term Goals - 06/06/19 1609      OT SHORT TERM GOAL #1   Title  Patient will be educated and independent with HEP in order to faciliate her progress in therapy and allow her to return to using her RUE for her daily and leisure tasks.    Time  3    Period  Weeks    Status  On-going    Target Date  06/25/19      OT SHORT TERM GOAL #2   Title  Patient will demonstrate P/ROM WFL in order to begin using her Right hand as her dominant hand for 50% of daily tasks.    Time  3     Period  Weeks    Status  On-going        OT Long Term Goals - 06/06/19 1609      OT LONG TERM GOAL #1   Title  Patient will reach her highest level of independence with all daily and leisure tasks while using her RUE as her dominant extremity for 75% or more daily tasks.    Time  6    Period  Weeks    Status  On-going      OT LONG TERM GOAL #2   Title  Pt will increase her Right wrist and hand A/ROM to [facility] in order to return to her leisure tasks such as sewing.    Time  6    Period  Weeks    Status  On-going      OT LONG TERM GOAL #3   Title  Patient will increase her right grip strength by 15# and her pinch strength by 8# in order to hold onto small items without dropping them.    Time  6    Period  Weeks    Status  On-going      OT LONG TERM GOAL #4   Title  Patient will increase her right wrist strength to 4/5 in order to complete basic daily tasks such as grooming, dressing, and bathing with decreased difficulty.    Time  6    Period  Weeks    Status  On-going      OT LONG TERM GOAL #5   Title  patient will increase her right hand coordination by completing the 9 hole peg test in 22 seconds or less.    Time  6    Period  Weeks    Status  On-going            Plan - 06/11/19 1247    Clinical Impression Statement  A: Completed paraffin at  start of session to decrease joint stiffness and allow patient to increase her ROM. Patient presents with noticeable increase in passive ROM while being able to increase her tolerance during passive stretching of her hand. VC during sponges to include small finger to flex and hold sponges versus extended out.    Body Structure / Function / Physical Skills  ADL;Dexterity;ROM;IADL;Sensation;Mobility;Fascial restriction;Coordination;GMC;UE functional use;Pain;FMC;Strength;Flexibility    Plan  P: Hide beads in putty to work on pinch strength. Continue to work on increasing ROM of hand (especially the small finger) while forming a  closed fist.       Patient will benefit from skilled therapeutic intervention in order to improve the following deficits and impairments:   Body Structure / Function / Physical Skills: ADL, Dexterity, ROM, IADL, Sensation, Mobility, Fascial restriction, Coordination, GMC, UE functional use, Pain, FMC, Strength, Flexibility       Visit Diagnosis: Other symptoms and signs involving the musculoskeletal system  Stiffness of right wrist joint  Pain in right wrist  Localized edema  Other lack of coordination    Problem List There are no active problems to display for this patient.  Ailene Ravel, OTR/L,CBIS  [phone]  06/11/2019, 12:55 PM  [facility] [address] [place], [place], [address] Phone: [phone]   Fax:  [phone]  Name: Madeline Horton MRN: [id number] Date of Birth: 08/08/1956

## 2019-06-11 NOTE — Patient Instructions (Signed)
Home Exercises Program Theraputty Exercises  Do the following exercises 1-2 times a day using your affected hand.  1. Roll putty into a ball.  2. Make into a pancake.  3. Roll putty into a roll.  4. Pinch along log with first finger and thumb.   5. Make into a ball.  6. Roll it back into a log.   7. Pinch using thumb and side of first finger.  8. Roll into a ball, then flatten into a pancake.  9. Using your fingers, make putty into a mountain.  10. Roll putty into a ball then squeeze and release 10 times.

## 2019-06-13 ENCOUNTER — Other Ambulatory Visit: Payer: Self-pay

## 2019-06-13 ENCOUNTER — Encounter (HOSPITAL_COMMUNITY): Payer: Self-pay | Admitting: Occupational Therapy

## 2019-06-13 ENCOUNTER — Ambulatory Visit (HOSPITAL_COMMUNITY): Payer: Medicare Other | Admitting: Occupational Therapy

## 2019-06-13 DIAGNOSIS — R29898 Other symptoms and signs involving the musculoskeletal system: Secondary | ICD-10-CM | POA: Diagnosis not present

## 2019-06-13 DIAGNOSIS — R600 Localized edema: Secondary | ICD-10-CM

## 2019-06-13 DIAGNOSIS — M25531 Pain in right wrist: Secondary | ICD-10-CM | POA: Diagnosis not present

## 2019-06-13 DIAGNOSIS — M25631 Stiffness of right wrist, not elsewhere classified: Secondary | ICD-10-CM

## 2019-06-13 DIAGNOSIS — R278 Other lack of coordination: Secondary | ICD-10-CM | POA: Diagnosis not present

## 2019-06-13 NOTE — Therapy (Signed)
[facility] [place], [place], [address] Phone: [phone]   Fax:  [phone]  Occupational Therapy Treatment  Patient Details  Name: Madeline Horton MRN: [id number] Date of Birth: June 08, 1956 Referring Provider (OT): Les Pou, DO   Encounter Date: 06/13/2019  OT End of Session - 06/13/19 1529    Visit Number  4    Number of Visits  12    Date for OT Re-Evaluation  07/16/19    Authorization Type  1) [employer] 2) Eulis Foster    Authorization Time Period  progress note at visit 10.    Authorization - Visit Number  4    Authorization - Number of Visits  10    OT Start Time  1346    OT Stop Time  1427    OT Time Calculation (min)  41 min    Activity Tolerance  Patient tolerated treatment well    Behavior During Therapy  WFL for tasks assessed/performed       History reviewed. No pertinent past medical history.  Past Surgical History:  Procedure Laterality Date  . BREAST EXCISIONAL BIOPSY Left    benign    There were no vitals filed for this visit.  Subjective Assessment - 06/13/19 1345    Subjective   S: I've played with my putty two or three times today.    Currently in Pain?  Yes    Pain Score  2     Pain Location  Wrist   small finger   Pain Orientation  Right    Pain Descriptors / Indicators  Sore    Pain Type  Acute pain    Pain Radiating Towards  n/a    Pain Onset  More than a month ago    Pain Frequency  Intermittent    Aggravating Factors   movement and use    Pain Relieving Factors  Advil, simple gentle exercises, ice and heat    Effect of Pain on Daily Activities  max effect    Multiple Pain Sites  No         [facility] OT Assessment - 06/13/19 1345      Assessment   Medical Diagnosis  Right ORIF Wrist      Precautions   Precautions  Other (comment)    Precaution Comments  Progress as tolerated.                OT Treatments/Exercises (OP) - 06/13/19 1354       Exercises   Exercises  Wrist;Hand;Theraputty      Weighted Stretch Over Towel Roll   Wrist Flexion - Weighted Stretch  2 pounds;60 seconds    Wrist Extension - Weighted Stretch  2 pounds;60 seconds      Wrist Exercises   Wrist Flexion  PROM;AROM;10 reps    Wrist Extension  PROM;AROM;10 reps    Wrist Radial Deviation  PROM;5 reps;AROM;10 reps    Wrist Ulnar Deviation  PROM;5 reps;AROM;10 reps      Additional Wrist Exercises   Sponges  25      Hand Exercises   MCPJ Flexion  PROM;AROM;10 reps    PIPJ Flexion  PROM;AROM;5 reps;10 reps    DIPJ Flexion  PROM;AROM;10 reps      Modalities   Modalities  Moist Heat;Paraffin      Moist Heat Therapy   Number Minutes Moist Heat  5 Minutes    Moist Heat Location  Wrist;Hand  RUE Paraffin   Number Minutes Paraffin  5 Minutes    RUE Paraffin Location  Wrist   and small finger     Manual Therapy   Manual Therapy  Myofascial release    Manual therapy comments  Manual techniques completed prior to exercises    Myofascial Release  Myofascial release and manual stretching completed to right wrist and hand to decrease fascial restrictions and increase joint mobility in a pain free zone.       Fine Motor Coordination (Hand/Wrist)   Fine Motor Coordination  Tendon glides;Manipulating coins    Tendon Glides  10X    Manipulating coins  pt holding 5 coins in hand and working on palm to fingertip translation, min/mod difficulty initially. Pt then given 10 coins, min difficulty with manipulation. No coins dropped when placing in slotted container.                OT Short Term Goals - 06/06/19 1609      OT SHORT TERM GOAL #1   Title  Patient will be educated and independent with HEP in order to faciliate her progress in therapy and allow her to return to using her RUE for her daily and leisure tasks.    Time  3    Period  Weeks    Status  On-going    Target Date  06/25/19      OT SHORT TERM GOAL #2   Title  Patient will  demonstrate P/ROM WFL in order to begin using her Right hand as her dominant hand for 50% of daily tasks.    Time  3    Period  Weeks    Status  On-going        OT Long Term Goals - 06/06/19 1609      OT LONG TERM GOAL #1   Title  Patient will reach her highest level of independence with all daily and leisure tasks while using her RUE as her dominant extremity for 75% or more daily tasks.    Time  6    Period  Weeks    Status  On-going      OT LONG TERM GOAL #2   Title  Pt will increase her Right wrist and hand A/ROM to [facility] in order to return to her leisure tasks such as sewing.    Time  6    Period  Weeks    Status  On-going      OT LONG TERM GOAL #3   Title  Patient will increase her right grip strength by 15# and her pinch strength by 8# in order to hold onto small items without dropping them.    Time  6    Period  Weeks    Status  On-going      OT LONG TERM GOAL #4   Title  Patient will increase her right wrist strength to 4/5 in order to complete basic daily tasks such as grooming, dressing, and bathing with decreased difficulty.    Time  6    Period  Weeks    Status  On-going      OT LONG TERM GOAL #5   Title  patient will increase her right hand coordination by completing the 9 hole peg test in 22 seconds or less.    Time  6    Period  Weeks    Status  On-going            Plan - 06/13/19 1529  Clinical Impression Statement  A: Continued with paraffin at beginning of session as pt report improved tolerance to passive stretching. Added weighted wrist stretches today as well as wrist A/ROM. Completed tendon gliding and fine motor exercises working on improving ROM in digits. Pt able to make flat fist with exception of small finger after activities. Verbal cuing for form and technique.    Body Structure / Function / Physical Skills  ADL;Dexterity;ROM;IADL;Sensation;Mobility;Fascial restriction;Coordination;GMC;UE functional use;Pain;FMC;Strength;Flexibility     Plan  P: Hide beads in putty working on pinch strength. Continue with wrist A/ROM and stretching       Patient will benefit from skilled therapeutic intervention in order to improve the following deficits and impairments:   Body Structure / Function / Physical Skills: ADL, Dexterity, ROM, IADL, Sensation, Mobility, Fascial restriction, Coordination, GMC, UE functional use, Pain, FMC, Strength, Flexibility       Visit Diagnosis: Other symptoms and signs involving the musculoskeletal system  Stiffness of right wrist joint  Pain in right wrist  Localized edema  Other lack of coordination    Problem List There are no active problems to display for this patient.  Guadelupe Sabin, OTR/L  [phone] 06/13/2019, 3:34 PM  [facility] [address] [place], [place], [address] Phone: [phone]   Fax:  [phone]  Name: MEKALAH ZAPIEN MRN: [id number] Date of Birth: 1956-02-28

## 2019-06-19 ENCOUNTER — Ambulatory Visit (HOSPITAL_COMMUNITY): Payer: Medicare Other

## 2019-06-19 ENCOUNTER — Other Ambulatory Visit: Payer: Self-pay

## 2019-06-19 ENCOUNTER — Encounter (HOSPITAL_COMMUNITY): Payer: Self-pay

## 2019-06-19 DIAGNOSIS — M25531 Pain in right wrist: Secondary | ICD-10-CM

## 2019-06-19 DIAGNOSIS — M25631 Stiffness of right wrist, not elsewhere classified: Secondary | ICD-10-CM | POA: Diagnosis not present

## 2019-06-19 DIAGNOSIS — R29898 Other symptoms and signs involving the musculoskeletal system: Secondary | ICD-10-CM

## 2019-06-19 DIAGNOSIS — R278 Other lack of coordination: Secondary | ICD-10-CM | POA: Diagnosis not present

## 2019-06-19 DIAGNOSIS — R600 Localized edema: Secondary | ICD-10-CM

## 2019-06-19 NOTE — Therapy (Signed)
[facility] [place], [place], [address] Phone: [phone]   Fax:  [phone]  Occupational Therapy Treatment  Patient Details  Name: Madeline Horton MRN: [id number] Date of Birth: 12-03-1955 Referring Provider (OT): Les Pou, DO   Encounter Date: 06/19/2019  OT End of Session - 06/19/19 1138    Visit Number  5    Number of Visits  12    Date for OT Re-Evaluation  07/16/19    Authorization Type  1) [employer] 2) [employer]ulis Foster    Authorization Time Period  progress note at visit 10.    Authorization - Visit Number  5    Authorization - Number of Visits  10    OT Start Time  [id number]    OT Stop Time  1155    OT Time Calculation (min)  39 min    Activity Tolerance  Patient tolerated treatment well    Behavior During Therapy  WFL for tasks assessed/performed       History reviewed. No pertinent past medical history.  Past Surgical History:  Procedure Laterality Date  . BREAST EXCISIONAL BIOPSY Left    benign    There were no vitals filed for this visit.  Subjective Assessment - 06/19/19 1129    Subjective   S: I'm able to wipe myself after the bathroom now.    Currently in Pain?  No/denies         [facility] OT Assessment - 06/19/19 1326      Assessment   Medical Diagnosis  Right ORIF Wrist      Precautions   Precautions  Other (comment)    Precaution Comments  Progress as tolerated.                OT Treatments/Exercises (OP) - 06/19/19 1136      Exercises   Exercises  Wrist;Hand;Theraputty      Weighted Stretch Over Towel Roll   Wrist Flexion - Weighted Stretch  2 pounds;60 seconds    Wrist Extension - Weighted Stretch  2 pounds;60 seconds      Wrist Exercises   Wrist Flexion  PROM;AROM;10 reps    Wrist Extension  PROM;AROM;10 reps    Wrist Radial Deviation  PROM;5 reps;AROM;10 reps    Wrist Ulnar Deviation  PROM;5 reps;AROM;10 reps      Hand Exercises   MCPJ Flexion   PROM;AROM;10 reps    PIPJ Flexion  PROM;AROM;5 reps;10 reps    DIPJ Flexion  PROM;AROM;10 reps      Theraputty   Theraputty - Grip  yellow    Theraputty Hand- Locate Pegs  yellow- 10 beads located      Modalities   Modalities  Moist Heat;Paraffin      Moist Heat Therapy   Number Minutes Moist Heat  5 Minutes    Moist Heat Location  Wrist;Hand      RUE Paraffin   Number Minutes Paraffin  5 Minutes    RUE Paraffin Location  Wrist   hand              OT Short Term Goals - 06/06/19 1609      OT SHORT TERM GOAL #1   Title  Patient will be educated and independent with HEP in order to faciliate her progress in therapy and allow her to return to using her RUE for her daily and leisure tasks.    Time  3    Period  Weeks  Status  On-going    Target Date  06/25/19      OT SHORT TERM GOAL #2   Title  Patient will demonstrate P/ROM WFL in order to begin using her Right hand as her dominant hand for 50% of daily tasks.    Time  3    Period  Weeks    Status  On-going        OT Long Term Goals - 06/06/19 1609      OT LONG TERM GOAL #1   Title  Patient will reach her highest level of independence with all daily and leisure tasks while using her RUE as her dominant extremity for 75% or more daily tasks.    Time  6    Period  Weeks    Status  On-going      OT LONG TERM GOAL #2   Title  Pt will increase her Right wrist and hand A/ROM to [facility] in order to return to her leisure tasks such as sewing.    Time  6    Period  Weeks    Status  On-going      OT LONG TERM GOAL #3   Title  Patient will increase her right grip strength by 15# and her pinch strength by 8# in order to hold onto small items without dropping them.    Time  6    Period  Weeks    Status  On-going      OT LONG TERM GOAL #4   Title  Patient will increase her right wrist strength to 4/5 in order to complete basic daily tasks such as grooming, dressing, and bathing with decreased difficulty.    Time  6     Period  Weeks    Status  On-going      OT LONG TERM GOAL #5   Title  patient will increase her right hand coordination by completing the 9 hole peg test in 22 seconds or less.    Time  6    Period  Weeks    Status  On-going            Plan - 06/19/19 1321    Clinical Impression Statement  A: Continued with paraffin. patient reports being able to notice an improvement with use during functional tasks such as driving and toilet hygiene. VC for form and technique were completed. patient was able to form a complete fist during passive stretching.    Body Structure / Function / Physical Skills  ADL;Dexterity;ROM;IADL;Sensation;Mobility;Fascial restriction;Coordination;GMC;UE functional use;Pain;FMC;Strength;Flexibility    Plan  P: Complete 2 sets of weighted stretches. Velcro board for wrist flexion and extension ROM.    Consulted and Agree with Plan of Care  Patient       Patient will benefit from skilled therapeutic intervention in order to improve the following deficits and impairments:   Body Structure / Function / Physical Skills: ADL, Dexterity, ROM, IADL, Sensation, Mobility, Fascial restriction, Coordination, GMC, UE functional use, Pain, FMC, Strength, Flexibility       Visit Diagnosis: Pain in right wrist  Stiffness of right wrist joint  Other symptoms and signs involving the musculoskeletal system  Localized edema  Other lack of coordination    Problem List There are no active problems to display for this patient.  Ailene Ravel, OTR/L,CBIS  [phone]  06/19/2019, 1:26 PM  [facility] [address] [place], [place], [address] Phone: [phone]   Fax:  [phone]  Name:  ARMITA WAAG MRN: [id number] Date of Birth: 1955-10-30

## 2019-06-21 ENCOUNTER — Encounter (HOSPITAL_COMMUNITY): Payer: Self-pay | Admitting: Occupational Therapy

## 2019-06-21 ENCOUNTER — Ambulatory Visit (HOSPITAL_COMMUNITY): Payer: Medicare Other | Attending: Family Medicine | Admitting: Occupational Therapy

## 2019-06-21 ENCOUNTER — Other Ambulatory Visit: Payer: Self-pay

## 2019-06-21 DIAGNOSIS — M25531 Pain in right wrist: Secondary | ICD-10-CM | POA: Diagnosis not present

## 2019-06-21 DIAGNOSIS — R278 Other lack of coordination: Secondary | ICD-10-CM | POA: Diagnosis not present

## 2019-06-21 DIAGNOSIS — R600 Localized edema: Secondary | ICD-10-CM | POA: Insufficient documentation

## 2019-06-21 DIAGNOSIS — R29898 Other symptoms and signs involving the musculoskeletal system: Secondary | ICD-10-CM | POA: Insufficient documentation

## 2019-06-21 DIAGNOSIS — M25631 Stiffness of right wrist, not elsewhere classified: Secondary | ICD-10-CM | POA: Insufficient documentation

## 2019-06-21 DIAGNOSIS — Z23 Encounter for immunization: Secondary | ICD-10-CM | POA: Diagnosis not present

## 2019-06-21 NOTE — Therapy (Signed)
[place], [place], [address] Phone: [phone]   Fax:  [phone]  Occupational Therapy Treatment  Patient Details  Name: Madeline Horton MRN: [id number] Date of Birth: 08-28-1956 Referring Provider (OT): Les Pou, DO   Encounter Date: 06/21/2019  OT End of Session - 06/21/19 1138    Visit Number  6    Number of Visits  12    Date for OT Re-Evaluation  07/16/19    Authorization Type  1) [employer] 2) Eulis Foster    Authorization Time Period  progress note at visit 10.    Authorization - Visit Number  6    Authorization - Number of Visits  10    OT Start Time  [id number]    OT Stop Time  1114    OT Time Calculation (min)  42 min    Activity Tolerance  Patient tolerated treatment well    Behavior During Therapy  WFL for tasks assessed/performed       History reviewed. No pertinent past medical history.  Past Surgical History:  Procedure Laterality Date  . BREAST EXCISIONAL BIOPSY Left    benign    There were no vitals filed for this visit.  Subjective Assessment - 06/21/19 1031    Subjective   S: It looks swollen to me today.    Currently in Pain?  No/denies         [facility] OT Assessment - 06/21/19 1031      Assessment   Medical Diagnosis  Right ORIF Wrist      Precautions   Precautions  Other (comment)    Precaution Comments  Progress as tolerated.                OT Treatments/Exercises (OP) - 06/21/19 1037      Exercises   Exercises  Wrist;Hand;Theraputty      Weighted Stretch Over Towel Roll   Wrist Flexion - Weighted Stretch  2 pounds;60 seconds   2 reps   Wrist Extension - Weighted Stretch  2 pounds;60 seconds   2 reps     Wrist Exercises   Wrist Flexion  PROM;AROM;10 reps    Wrist Extension  PROM;AROM;10 reps    Wrist Radial Deviation  PROM;5 reps;AROM;10 reps    Wrist Ulnar Deviation  PROM;5 reps;AROM;10 reps    Other wrist exercises  pt used  velcro board working on wrist flexion and extension-3X each direction using wide velcro strip      Additional Wrist Exercises   Sponges  25, 25      Hand Exercises   MCPJ Flexion  PROM;AROM;10 reps    PIPJ Flexion  PROM;AROM;5 reps;10 reps    DIPJ Flexion  PROM;AROM;10 reps      Modalities   Modalities  Moist Heat;Paraffin      Moist Heat Therapy   Number Minutes Moist Heat  5 Minutes    Moist Heat Location  Wrist;Hand      RUE Paraffin   Number Minutes Paraffin  5 Minutes    RUE Paraffin Location  Wrist   hand     Manual Therapy   Manual Therapy  Myofascial release    Manual therapy comments  Manual techniques completed prior to exercises    Myofascial Release  Myofascial release and manual stretching completed to right wrist and hand to decrease fascial restrictions and increase joint mobility in a pain free zone.  OT Education - 06/21/19 1137    Education Details  provided flexion glove and educated on using 3x/day for 30 minutes at a time    Person(s) Educated  Patient    Methods  Explanation;Verbal cues;Handout    Comprehension  Returned demonstration;Verbalized understanding       OT Short Term Goals - 06/06/19 1609      OT SHORT TERM GOAL #1   Title  Patient will be educated and independent with HEP in order to faciliate her progress in therapy and allow her to return to using her RUE for her daily and leisure tasks.    Time  3    Period  Weeks    Status  On-going    Target Date  06/25/19      OT SHORT TERM GOAL #2   Title  Patient will demonstrate P/ROM WFL in order to begin using her Right hand as her dominant hand for 50% of daily tasks.    Time  3    Period  Weeks    Status  On-going        OT Long Term Goals - 06/06/19 1609      OT LONG TERM GOAL #1   Title  Patient will reach her highest level of independence with all daily and leisure tasks while using her RUE as her dominant extremity for 75% or more daily tasks.    Time  6     Period  Weeks    Status  On-going      OT LONG TERM GOAL #2   Title  Pt will increase her Right wrist and hand A/ROM to [facility] in order to return to her leisure tasks such as sewing.    Time  6    Period  Weeks    Status  On-going      OT LONG TERM GOAL #3   Title  Patient will increase her right grip strength by 15# and her pinch strength by 8# in order to hold onto small items without dropping them.    Time  6    Period  Weeks    Status  On-going      OT LONG TERM GOAL #4   Title  Patient will increase her right wrist strength to 4/5 in order to complete basic daily tasks such as grooming, dressing, and bathing with decreased difficulty.    Time  6    Period  Weeks    Status  On-going      OT LONG TERM GOAL #5   Title  patient will increase her right hand coordination by completing the 9 hole peg test in 22 seconds or less.    Time  6    Period  Weeks    Status  On-going            Plan - 06/21/19 1104    Clinical Impression Statement  A: Pt reporting increased stiffness this session. Completed manual therapy to address slight increase in edema along volar wrist today. Increased weighted stretches to 2 sets and added velcro board task for wrist flexion and extension. Verbal cuing for form and technique. Provided flexion glove this session and tightened rubberbands to appropriate level to work on digit flexion.    Body Structure / Function / Physical Skills  ADL;Dexterity;ROM;IADL;Sensation;Mobility;Fascial restriction;Coordination;GMC;UE functional use;Pain;FMC;Strength;Flexibility    Plan  P: Follow up on flexion glove use. 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  Guadelupe Sabin, OTR/L  [phone] 06/21/2019, 11:39 AM  [facility] [address] [place], [place], [address] Phone: [phone]   Fax:  [phone]  Name: Madeline Horton MRN: [id number] Date of Birth: 12-Jan-1956

## 2019-06-25 ENCOUNTER — Encounter (HOSPITAL_COMMUNITY): Payer: Self-pay

## 2019-06-25 ENCOUNTER — Other Ambulatory Visit: Payer: Self-pay

## 2019-06-25 ENCOUNTER — Ambulatory Visit (HOSPITAL_COMMUNITY): Payer: Medicare Other

## 2019-06-25 DIAGNOSIS — R278 Other lack of coordination: Secondary | ICD-10-CM

## 2019-06-25 DIAGNOSIS — R29898 Other symptoms and signs involving the musculoskeletal system: Secondary | ICD-10-CM | POA: Diagnosis not present

## 2019-06-25 DIAGNOSIS — R600 Localized edema: Secondary | ICD-10-CM

## 2019-06-25 DIAGNOSIS — M25631 Stiffness of right wrist, not elsewhere classified: Secondary | ICD-10-CM

## 2019-06-25 DIAGNOSIS — M25531 Pain in right wrist: Secondary | ICD-10-CM | POA: Diagnosis not present

## 2019-06-25 NOTE — Therapy (Signed)
[place], [place], [address] Phone: [phone]   Fax:  [phone]  Occupational Therapy Treatment  Patient Details  Name: Madeline Horton MRN: [id number] Date of Birth: 01-12-1956 Referring Provider (OT): Les Pou, DO   Encounter Date: 06/25/2019  OT End of Session - 06/25/19 1252    Visit Number  7    Number of Visits  12    Date for OT Re-Evaluation  07/16/19    Authorization Type  1) [employer] 2) [employer]ulis Foster    Authorization Time Period  progress note at visit 10.    Authorization - Visit Number  7    Authorization - Number of Visits  10    OT Start Time  1030    OT Stop Time  1108    OT Time Calculation (min)  38 min    Activity Tolerance  Patient tolerated treatment well    Behavior During Therapy  WFL for tasks assessed/performed       History reviewed. No pertinent past medical history.  Past Surgical History:  Procedure Laterality Date  . BREAST EXCISIONAL BIOPSY Left    benign    There were no vitals filed for this visit.  Subjective Assessment - 06/25/19 1041    Subjective   S: I'm able to do a stitch on the edge of flannel but I haven't been able to return to quilting yet.    Currently in Pain?  No/denies         [facility] OT Assessment - 06/25/19 1059      Assessment   Medical Diagnosis  Right ORIF Wrist      Precautions   Precautions  Other (comment)    Precaution Comments  Progress as tolerated.                OT Treatments/Exercises (OP) - 06/25/19 1046      Exercises   Exercises  Wrist;Hand;Theraputty      Wrist Exercises   Wrist Flexion  PROM;AROM;10 reps    Wrist Extension  PROM;AROM;10 reps    Wrist Radial Deviation  PROM;5 reps;AROM;10 reps    Wrist Ulnar Deviation  PROM;5 reps;AROM;10 reps      Additional Wrist Exercises   Hand Gripper with Large Beads  all beads with gripper set at 11#   horizontal   Hand Gripper with Medium  Beads  all beads with gripper set at 11#   horizontal   Hand Gripper with Small Beads  all beads with gripper set at 11#   horizontal     Hand Exercises   MCPJ Flexion  PROM;AROM;10 reps    PIPJ Flexion  PROM;AROM;10 reps    DIPJ Flexion  PROM;AROM;10 reps      Modalities   Modalities  Moist Heat;Paraffin      Moist Heat Therapy   Number Minutes Moist Heat  5 Minutes    Moist Heat Location  Wrist;Hand      RUE Paraffin   Number Minutes Paraffin  5 Minutes    RUE Paraffin Location  Wrist   hand     Manual Therapy   Manual Therapy  Myofascial release;Muscle Energy Technique    Manual therapy comments  Manual techniques completed prior to exercises    Muscle Energy Technique  Muscle energy technigue completed to wrist flexors and extensors to decrease tone and muscle spasm and increase ROM.       Fine Motor Coordination (Hand/Wrist)  Fine Motor Coordination  Picking up coins;Manipulating coins;Stacking coins    Picking up coins  Pt completed in hand manipulation activity while picking up 5 coins one at a time and transferring from finger tip to palm and then back to finger tip to stack                OT Short Term Goals - 06/06/19 1609      OT [place] #1   Title  Patient will be educated and independent with HEP in order to faciliate her progress in therapy and allow her to return to using her RUE for her daily and leisure tasks.    Time  3    Period  Weeks    Status  On-going    Target Date  06/25/19      OT SHORT TERM GOAL #2   Title  Patient will demonstrate P/ROM WFL in order to begin using her Right hand as her dominant hand for 50% of daily tasks.    Time  3    Period  Weeks    Status  On-going        OT Long Term Goals - 06/06/19 1609      OT LONG TERM GOAL #1   Title  Patient will reach her highest level of independence with all daily and leisure tasks while using her RUE as her dominant extremity for 75% or more daily tasks.    Time  6     Period  Weeks    Status  On-going      OT LONG TERM GOAL #2   Title  Pt will increase her Right wrist and hand A/ROM to [facility] in order to return to her leisure tasks such as sewing.    Time  6    Period  Weeks    Status  On-going      OT LONG TERM GOAL #3   Title  Patient will increase her right grip strength by 15# and her pinch strength by 8# in order to hold onto small items without dropping them.    Time  6    Period  Weeks    Status  On-going      OT LONG TERM GOAL #4   Title  Patient will increase her right wrist strength to 4/5 in order to complete basic daily tasks such as grooming, dressing, and bathing with decreased difficulty.    Time  6    Period  Weeks    Status  On-going      OT LONG TERM GOAL #5   Title  patient will increase her right hand coordination by completing the 9 hole peg test in 22 seconds or less.    Time  6    Period  Weeks    Status  On-going            Plan - 06/25/19 1252    Clinical Impression Statement  A: Focused on on coordination and in hand manipulation while also adding the resistive handgripper to focus on grip strength. patient completed coin and grooved pegboard task with moderate difficulty although was able to complete with increased ability to hold a closed fist.    Body Structure / Function / Physical Skills  ADL;Dexterity;ROM;IADL;Sensation;Mobility;Fascial restriction;Coordination;GMC;UE functional use;Pain;FMC;Strength;Flexibility    Plan  P: Continue with grip and pinch strengthening. Add wrist extension stretch while standing at table.    Consulted and Agree with Plan of Care  Patient  Patient will benefit from skilled therapeutic intervention in order to improve the following deficits and impairments:   Body Structure / Function / Physical Skills: ADL, Dexterity, ROM, IADL, Sensation, Mobility, Fascial restriction, Coordination, GMC, UE functional use, Pain, FMC, Strength, Flexibility       Visit Diagnosis: Pain  in right wrist  Other symptoms and signs involving the musculoskeletal system  Stiffness of right wrist joint  Other lack of coordination  Localized edema    Problem List There are no active problems to display for this patient.  Ailene Ravel, OTR/L,CBIS  [phone]  06/25/2019, 12:57 PM  [facility] [address] [place], [place], [address] Phone: [phone]   Fax:  [phone]  Name: Madeline Horton MRN: [id number] Date of Birth: 05-12-1956

## 2019-06-27 ENCOUNTER — Ambulatory Visit (HOSPITAL_COMMUNITY): Payer: Medicare Other

## 2019-06-27 ENCOUNTER — Other Ambulatory Visit: Payer: Self-pay

## 2019-06-27 ENCOUNTER — Encounter (HOSPITAL_COMMUNITY): Payer: Self-pay

## 2019-06-27 DIAGNOSIS — R600 Localized edema: Secondary | ICD-10-CM | POA: Diagnosis not present

## 2019-06-27 DIAGNOSIS — R278 Other lack of coordination: Secondary | ICD-10-CM | POA: Diagnosis not present

## 2019-06-27 DIAGNOSIS — R29898 Other symptoms and signs involving the musculoskeletal system: Secondary | ICD-10-CM | POA: Diagnosis not present

## 2019-06-27 DIAGNOSIS — M25631 Stiffness of right wrist, not elsewhere classified: Secondary | ICD-10-CM

## 2019-06-27 DIAGNOSIS — M25531 Pain in right wrist: Secondary | ICD-10-CM | POA: Diagnosis not present

## 2019-06-27 NOTE — Patient Instructions (Signed)
WRIST EXTENSION STRETCH - TABLE  Place boths hand on a table as shown and gently lean forward until a stretch is felt. Hold for 20-30 seconds. Complete 2 times.

## 2019-06-27 NOTE — Therapy (Signed)
[place], [place], [address] Phone: [phone]   Fax:  [phone]  Occupational Therapy Treatment  Patient Details  Name: Madeline Horton MRN: [id number] Date of Birth: 1956/08/29 Referring Provider (OT): Les Pou, DO   Encounter Date: 06/27/2019  OT End of Session - 06/27/19 1149    Visit Number  8    Number of Visits  12    Date for OT Re-Evaluation  07/16/19    Authorization Type  1) [employer] 2) [employer]ulis Foster    Authorization Time Period  progress note at visit 10.    Authorization - Visit Number  8    Authorization - Number of Visits  10    OT Start Time  [id number]    OT Stop Time  1111    OT Time Calculation (min)  38 min    Activity Tolerance  Patient tolerated treatment well    Behavior During Therapy  WFL for tasks assessed/performed       History reviewed. No pertinent past medical history.  Past Surgical History:  Procedure Laterality Date  . BREAST EXCISIONAL BIOPSY Left    benign    There were no vitals filed for this visit.  Subjective Assessment - 06/27/19 1037    Subjective   S: I tried to quilt the other day and I did it! I just did a little bit because it was started to hurt but I was so excited.    Currently in Pain?  No/denies         [facility] OT Assessment - 06/27/19 1056      Assessment   Medical Diagnosis  Right ORIF Wrist      Precautions   Precautions  Other (comment)    Precaution Comments  Progress as tolerated.                OT Treatments/Exercises (OP) - 06/27/19 1051      Exercises   Exercises  Wrist;Hand;Theraputty      Wrist Exercises   Wrist Flexion  PROM;AROM;10 reps    Wrist Extension  PROM;AROM;10 reps    Wrist Radial Deviation  PROM;5 reps    Wrist Ulnar Deviation  PROM;5 reps    Other wrist exercises  Wrist extension stretch using table top; 2x20      Additional Wrist Exercises   Hand Gripper with Large Beads  all  beads with gripper set at 22#   horizontal   Hand Gripper with Medium Beads  all beads with gripper set at 22#   horizontal   Hand Gripper with Small Beads  all beads with gripper set at 22#   horizontal     Hand Exercises   Other Hand Exercises  Pt utilized red resistive clothespin and 3 point pinch to pick up and transfer 30 sponges from table top to container.     Other Hand Exercises  Utilized pvc pipe to cut 10 circles into yellow theraputty focusing on grip strength and wrist flexion/extension ROM.       Theraputty   Theraputty - Flatten  yellow- standing      Modalities   Modalities  Moist Heat;Paraffin      Moist Heat Therapy   Number Minutes Moist Heat  5 Minutes    Moist Heat Location  Wrist;Hand      RUE Paraffin   Number Minutes Paraffin  5 Minutes    RUE Paraffin Location  Wrist   hand  OT Short Term Goals - 06/06/19 1609      OT SHORT TERM GOAL #1   Title  Patient will be educated and independent with HEP in order to faciliate her progress in therapy and allow her to return to using her RUE for her daily and leisure tasks.    Time  3    Period  Weeks    Status  On-going    Target Date  06/25/19      OT SHORT TERM GOAL #2   Title  Patient will demonstrate P/ROM WFL in order to begin using her Right hand as her dominant hand for 50% of daily tasks.    Time  3    Period  Weeks    Status  On-going        OT Long Term Goals - 06/06/19 1609      OT LONG TERM GOAL #1   Title  Patient will reach her highest level of independence with all daily and leisure tasks while using her RUE as her dominant extremity for 75% or more daily tasks.    Time  6    Period  Weeks    Status  On-going      OT LONG TERM GOAL #2   Title  Pt will increase her Right wrist and hand A/ROM to [facility] in order to return to her leisure tasks such as sewing.    Time  6    Period  Weeks    Status  On-going      OT LONG TERM GOAL #3   Title  Patient will increase  her right grip strength by 15# and her pinch strength by 8# in order to hold onto small items without dropping them.    Time  6    Period  Weeks    Status  On-going      OT LONG TERM GOAL #4   Title  Patient will increase her right wrist strength to 4/5 in order to complete basic daily tasks such as grooming, dressing, and bathing with decreased difficulty.    Time  6    Period  Weeks    Status  On-going      OT LONG TERM GOAL #5   Title  patient will increase her right hand coordination by completing the 9 hole peg test in 22 seconds or less.    Time  6    Period  Weeks    Status  On-going            Plan - 06/27/19 1151    Clinical Impression Statement  A: Pt was provided with wrist extension stretch for HEP. Continued to focus on grip and pinch while increasing hangripper resistance to 22#. VC for form and technique were provided.    Body Structure / Function / Physical Skills  ADL;Dexterity;ROM;IADL;Sensation;Mobility;Fascial restriction;Coordination;GMC;UE functional use;Pain;FMC;Strength;Flexibility    Plan  P: Continue with pvc pipe and putty to increase wrist flexion and extension and grip strength.    Consulted and Agree with Plan of Care  Patient       Patient will benefit from skilled therapeutic intervention in order to improve the following deficits and impairments:   Body Structure / Function / Physical Skills: ADL, Dexterity, ROM, IADL, Sensation, Mobility, Fascial restriction, Coordination, GMC, UE functional use, Pain, FMC, Strength, Flexibility       Visit Diagnosis: Pain in right wrist  Other symptoms and signs involving the musculoskeletal system  Stiffness of right wrist joint  Other lack of coordination  Localized edema    Problem List There are no active problems to display for this patient.  Ailene Ravel, OTR/L,CBIS  [phone]  06/27/2019, 12:55 PM  [place], [place], [address] Phone: [phone]   Fax:  [phone]  Name: SINEA CARCHI MRN: [id number] Date of Birth: 1956-02-24

## 2019-07-02 ENCOUNTER — Ambulatory Visit (HOSPITAL_COMMUNITY): Payer: Medicare Other

## 2019-07-02 ENCOUNTER — Encounter (HOSPITAL_COMMUNITY): Payer: Self-pay

## 2019-07-02 ENCOUNTER — Other Ambulatory Visit: Payer: Self-pay

## 2019-07-02 DIAGNOSIS — M25631 Stiffness of right wrist, not elsewhere classified: Secondary | ICD-10-CM | POA: Diagnosis not present

## 2019-07-02 DIAGNOSIS — M25531 Pain in right wrist: Secondary | ICD-10-CM | POA: Diagnosis not present

## 2019-07-02 DIAGNOSIS — R29898 Other symptoms and signs involving the musculoskeletal system: Secondary | ICD-10-CM

## 2019-07-02 DIAGNOSIS — R600 Localized edema: Secondary | ICD-10-CM | POA: Diagnosis not present

## 2019-07-02 DIAGNOSIS — R278 Other lack of coordination: Secondary | ICD-10-CM | POA: Diagnosis not present

## 2019-07-02 NOTE — Therapy (Signed)
[place], [place], [address] Phone: [phone]   Fax:  [phone]  Occupational Therapy Treatment  Patient Details  Name: Madeline Horton MRN: [id number] Date of Birth: July 28, 1956 Referring Provider (OT): Les Pou, DO   Encounter Date: 07/02/2019  OT End of Session - 07/02/19 1117    Visit Number  9    Number of Visits  12    Date for OT Re-Evaluation  07/16/19    Authorization Type  1) [employer] 2) [employer]ul[employer]    Authorization Time Period  progress note at visit 10.    Authorization - Visit Number  9    Authorization - Number of Visits  10    OT Start Time  1030    OT Stop Time  1108    OT Time Calculation (min)  38 min    Activity Tolerance  Patient tolerated treatment well    Behavior During Therapy  WFL for tasks assessed/performed       History reviewed. No pertinent past medical history.  Past Surgical History:  Procedure Laterality Date  . BREAST EXCISIONAL BIOPSY Left    benign    There were no vitals filed for this visit.  Subjective Assessment - 07/02/19 1050    Subjective   S: it just feels stiff. I can't pick pick up anything when I'm grocery shopping because I'll drop it. I'll make sure not pick up anything glass.    Currently in Pain?  No/denies         [facility] OT Assessment - 07/02/19 1051      Assessment   Medical Diagnosis  Right ORIF Wrist      Precautions   Precautions  Other (comment)    Precaution Comments  Progress as tolerated.                OT Treatments/Exercises (OP) - 07/02/19 1051      Exercises   Exercises  Wrist;Hand;Theraputty      Hand Exercises   Other Hand Exercises  Utilized pvc pipe to cut 10 circles into yellow theraputty focusing on grip strength and wrist flexion/extension ROM.       Theraputty   Theraputty - Flatten  yellow- standing    Theraputty - Grip  yellow    Theraputty - Pinch  yellow- all digits  composite  pinch      Modalities   Modalities  Moist Heat;Paraffin      Moist Heat Therapy   Number Minutes Moist Heat  5 Minutes      RUE Paraffin   Number Minutes Paraffin  5 Minutes    RUE Paraffin Location  Wrist   hand     Manual Therapy   Manual Therapy  Myofascial release    Manual therapy comments  Manual techniques completed prior to exercises    Myofascial Release  Myofascial release and manual stretching completed to right wrist and hand to decrease fascial restrictions and increase joint mobility in a pain free zone.       Fine Motor Coordination (Hand/Wrist)   Fine Motor Coordination  Grooved pegs    Grooved pegs  Using tweezers to pick up 10 grooved pegs and place in pegboard. Initially held tweezers in tripod grasp and modified to a pronated grip for just right challenge. Increase time needed to complete with max difficulty.                OT Short Term  Goals - 06/06/19 1609      OT SHORT TERM GOAL #1   Title  Patient will be educated and independent with HEP in order to faciliate her progress in therapy and allow her to return to using her RUE for her daily and leisure tasks.    Time  3    Period  Weeks    Status  On-going    Target Date  06/25/19      OT SHORT TERM GOAL #2   Title  Patient will demonstrate P/ROM WFL in order to begin using her Right hand as her dominant hand for 50% of daily tasks.    Time  3    Period  Weeks    Status  On-going        OT Long Term Goals - 06/06/19 1609      OT LONG TERM GOAL #1   Title  Patient will reach her highest level of independence with all daily and leisure tasks while using her RUE as her dominant extremity for 75% or more daily tasks.    Time  6    Period  Weeks    Status  On-going      OT LONG TERM GOAL #2   Title  Pt will increase her Right wrist and hand A/ROM to [facility] in order to return to her leisure tasks such as sewing.    Time  6    Period  Weeks    Status  On-going      OT LONG TERM  GOAL #3   Title  Patient will increase her right grip strength by 15# and her pinch strength by 8# in order to hold onto small items without dropping them.    Time  6    Period  Weeks    Status  On-going      OT LONG TERM GOAL #4   Title  Patient will increase her right wrist strength to 4/5 in order to complete basic daily tasks such as grooming, dressing, and bathing with decreased difficulty.    Time  6    Period  Weeks    Status  On-going      OT LONG TERM GOAL #5   Title  patient will increase her right hand coordination by completing the 9 hole peg test in 22 seconds or less.    Time  6    Period  Weeks    Status  On-going            Plan - 07/02/19 1117    Clinical Impression Statement  A: Continued to work on increase ROM during wrist flexion and extension with VC to keep elbow relaxed and shoulder adducted during all activities. Increased difficulty with grooved pegboard while using tweezers although modified task to success while continuing to work on increasing fine motor coordination. VC for form and technique.    Body Structure / Function / Physical Skills  ADL;Dexterity;ROM;IADL;Sensation;Mobility;Fascial restriction;Coordination;GMC;UE functional use;Pain;FMC;Strength;Flexibility    Plan  P: 10th visit progress note. Continue to work on increasing flexion at MCP joints of hand. Increase ROM during wrist flexion and extension. Resume handgripper for strengthening.    Consulted and Agree with Plan of Care  Patient       Patient will benefit from skilled therapeutic intervention in order to improve the following deficits and impairments:   Body Structure / Function / Physical Skills: ADL, Dexterity, ROM, IADL, Sensation, Mobility, Fascial restriction, Coordination, GMC, UE functional use, Pain, FMC,  Strength, Flexibility       Visit Diagnosis: Other lack of coordination  Localized edema  Stiffness of right wrist joint  Other symptoms and signs involving the  musculoskeletal system  Pain in right wrist    Problem List There are no active problems to display for this patient.  Ailene Ravel, OTR/L,CBIS  [phone]  07/02/2019, 11:23 AM  [place] [address] [place], [place], [address] Phone: [phone]   Fax:  [phone]  Name: BESA LOWTHER MRN: [id number] Date of Birth: 10/20/1955

## 2019-07-04 ENCOUNTER — Encounter (HOSPITAL_COMMUNITY): Payer: Self-pay | Admitting: Occupational Therapy

## 2019-07-04 ENCOUNTER — Ambulatory Visit (HOSPITAL_COMMUNITY): Payer: Medicare Other | Admitting: Occupational Therapy

## 2019-07-04 ENCOUNTER — Other Ambulatory Visit: Payer: Self-pay

## 2019-07-04 DIAGNOSIS — R29898 Other symptoms and signs involving the musculoskeletal system: Secondary | ICD-10-CM | POA: Diagnosis not present

## 2019-07-04 DIAGNOSIS — R600 Localized edema: Secondary | ICD-10-CM | POA: Diagnosis not present

## 2019-07-04 DIAGNOSIS — M25531 Pain in right wrist: Secondary | ICD-10-CM

## 2019-07-04 DIAGNOSIS — M25631 Stiffness of right wrist, not elsewhere classified: Secondary | ICD-10-CM

## 2019-07-04 DIAGNOSIS — R278 Other lack of coordination: Secondary | ICD-10-CM

## 2019-07-04 NOTE — Therapy (Signed)
[place], [place], [address] Phone: [phone]   Fax:  [phone]  Occupational Therapy Reassessment, Treatment (recertification)  Patient Details  Name: Madeline Horton MRN: [id number] Date of Birth: 06/30/1956 Referring Provider (OT): Les Pou, DO   Progress Note Reporting Period 06/04/19 to 07/04/19  See note below for Objective Data and Assessment of Progress/Goals.       Encounter Date: 07/04/2019  OT End of Session - 07/04/19 1108    Visit Number  10    Number of Visits  16    Date for OT Re-Evaluation  07/27/19    Authorization Type  1) [employer] 2) [employer]    Authorization Time Period  progress note at visit 44.    Authorization - Visit Number  10    Authorization - Number of Visits  20    OT Start Time  1020    OT Stop Time  1105    OT Time Calculation (min)  45 min    Activity Tolerance  Patient tolerated treatment well    Behavior During Therapy  WFL for tasks assessed/performed       History reviewed. No pertinent past medical history.  Past Surgical History:  Procedure Laterality Date  . BREAST EXCISIONAL BIOPSY Left    benign    There were no vitals filed for this visit.  Subjective Assessment - 07/04/19 1019    Subjective   S: I cooked breakfast by myself one morning.    Currently in Pain?  No/denies         [facility] OT Assessment - 07/04/19 1019      Assessment   Medical Diagnosis  Right ORIF Wrist      Precautions   Precautions  Other (comment)    Precaution Comments  Progress as tolerated.       AROM   Overall AROM Comments  Assessed seated    Right/Left Wrist  Right    Right Wrist Extension  40 Degrees   30 previous   Right Wrist Flexion  40 Degrees   34 previous   Right Wrist Radial Deviation  20 Degrees   same as previous   Right Wrist Ulnar Deviation  25 Degrees   12 previous     PROM   Right/Left Wrist  Right    Right  Wrist Extension  52 Degrees   48 previous   Right Wrist Flexion  48 Degrees   40 previous   Right Wrist Radial Deviation  26 Degrees   same as previous   Right Wrist Ulnar Deviation  25 Degrees   20 previous     Strength   Right/Left Wrist  Right    Right Wrist Flexion  4/5   3-/5 previous   Right Wrist Extension  4+/5   3-/5 previous   Right Wrist Radial Deviation  4/5   3-/5 previous   Right Wrist Ulnar Deviation  4/5   3-/5 previous   Right Hand Grip (lbs)  21   6 previous   Right Hand Lateral Pinch  16 lbs   16   Right Hand 3 Point Pinch  6 lbs   5     Right Hand AROM   R Thumb MCP 0-60  50 Degrees   26 previous   R Thumb IP 0-80  70 Degrees   40 previous   R Index  MCP 0-90  60 Degrees   50 previous  R Index PIP 0-100  78 Degrees   82 previous   R Index DIP 0-70  50 Degrees   42 previous   R Long  MCP 0-90  60 Degrees   48 previous   R Long PIP 0-100  78 Degrees   same as previous   R Long DIP 0-70  48 Degrees   same as previous   R Ring  MCP 0-90  42 Degrees   same as previous   R Ring PIP 0-100  80 Degrees   same as previous   R Ring DIP 0-70  62 Degrees   54 previous   R Little  MCP 0-90  32 Degrees   0 previous   R Little PIP 0-100  72 Degrees   50 previous   R Little DIP 0-70  70 Degrees   48 previous        Quick Dash - 07/04/19 1042    Open a tight or new jar  Unable    Do heavy household chores (wash walls, wash floors)  Unable    Carry a shopping bag or briefcase  Moderate difficulty    Wash your back  Mild difficulty    Use a knife to cut food  Unable    Recreational activities in which you take some force or impact through your arm, shoulder, or hand (golf, hammering, tennis)  Severe difficulty    During the past week, to what extent has your arm, shoulder or hand problem interfered with your normal social activities with family, friends, neighbors, or groups?  Not at all    During the past week, to what extent has your arm,  shoulder or hand problem limited your work or other regular daily activities  Slightly    Arm, shoulder, or hand pain.  Mild    Tingling (pins and needles) in your arm, shoulder, or hand  Mild    Difficulty Sleeping  No difficulty    DASH Score  47.73 %           OT Treatments/Exercises (OP) - 07/04/19 1023      Exercises   Exercises  Wrist;Hand;Theraputty      Wrist Exercises   Wrist Flexion  PROM;5 reps;AROM;15 reps    Wrist Extension  PROM;5 reps;AROM;15 reps    Wrist Radial Deviation  PROM;5 reps;AROM;15 reps    Wrist Ulnar Deviation  PROM;5 reps;AROM;15 reps      Additional Wrist Exercises   Hand Gripper with Large Beads  all beads with gripper set at 25#   horizontal   Hand Gripper with Medium Beads  all beads with gripper set at 25#   horizontal   Hand Gripper with Small Beads  all beads with gripper set at 25#   horizontal     Hand Exercises   MCPJ Flexion  PROM;5 reps    PIPJ Flexion  PROM;5 reps    DIPJ Flexion  PROM;5 reps      Manual Therapy   Manual Therapy  Myofascial release    Manual therapy comments  Manual techniques completed prior to exercises    Myofascial Release  Myofascial release and manual stretching completed to right wrist and hand to decrease fascial restrictions and increase joint mobility in a pain free zone.                OT Short Term Goals - 07/04/19 1111      OT SHORT TERM GOAL #1   Title  Patient will  be educated and independent with HEP in order to faciliate her progress in therapy and allow her to return to using her RUE for her daily and leisure tasks.    Time  3    Period  Weeks    Status  On-going    Target Date  06/25/19      OT SHORT TERM GOAL #2   Title  Patient will demonstrate P/ROM WFL in order to begin using her Right hand as her dominant hand for 50% of daily tasks.    Time  3    Period  Weeks    Status  Partially Met        OT Long Term Goals - 07/04/19 1111      OT LONG TERM GOAL #1   Title   Patient will reach her highest level of independence with all daily and leisure tasks while using her RUE as her dominant extremity for 75% or more daily tasks.    Time  6    Period  Weeks    Status  On-going      OT LONG TERM GOAL #2   Title  Pt will increase her Right wrist and hand A/ROM to [facility] in order to return to her leisure tasks such as sewing.    Time  6    Period  Weeks    Status  On-going      OT LONG TERM GOAL #3   Title  Patient will increase her right grip strength by 25# and her pinch strength by 8# in order to hold onto small items without dropping them.    Baseline  10/14: revised grip strength to reflect pt's current functioning; has not met pinch strength as of 10/14    Time  6    Period  Weeks    Status  Revised      OT LONG TERM GOAL #4   Title  Patient will increase her right wrist strength to 5/5 in order to complete basic daily tasks such as grooming, dressing, and bathing with decreased difficulty.    Baseline  10/14: revised to reflect pt's current functional level    Time  6    Period  Weeks    Status  Revised      OT LONG TERM GOAL #5   Title  patient will increase her right hand coordination by completing the 9 hole peg test in 22 seconds or less.    Time  6    Period  Weeks    Status  On-going            Plan - 07/04/19 1110    Clinical Impression Statement  A: Reassessment completed this session for progress note. Pt has made improvements in ROM, strength, and functional use of the right wrist and hand. Goals reviewed and 2 LTGs revised to reflect pt's current level of functioning. Pt reports improvement in daily tasks, she continues to have difficulty with lifting and carrying items as she will drop weighted items. Pt is unable to make full fist however is improving and continues to use flexion glove 4x/day.    OT Occupational Profile and History  Detailed Assessment- Review of Records and additional review of physical, cognitive, psychosocial  history related to current functional performance    Occupational performance deficits (Please refer to evaluation for details):  ADL's;IADL's;Leisure    Body Structure / Function / Physical Skills  ADL;Dexterity;ROM;IADL;Sensation;Mobility;Fascial restriction;Coordination;GMC;UE functional use;Pain;FMC;Strength;Flexibility    Rehab Potential  Excellent  Clinical Decision Making  Several treatment options, min-mod task modification necessary    Comorbidities Affecting Occupational Performance:  None    Modification or Assistance to Complete Evaluation   Min-Moderate modification of tasks or assist with assess necessary to complete eval    OT Frequency  2x / week    OT Duration  --   3 weeks   OT Treatment/Interventions  Self-care/ADL training;Therapeutic exercise;Splinting;Manual Therapy;Neuromuscular education;Ultrasound;Cryotherapy;Electrical Stimulation;Moist Heat;Passive range of motion;Contrast Bath;Patient/family education;Paraffin;DME and/or AE instruction;Therapeutic activities    Plan  P: Continued skilled OT services 2x/week for 3 additional weeks working on ROM, wrist strength, and grip/pinch strength required for functional task completion. Next session: assess coordination, increase hand gripper resistance       Patient will benefit from skilled therapeutic intervention in order to improve the following deficits and impairments:   Body Structure / Function / Physical Skills: ADL, Dexterity, ROM, IADL, Sensation, Mobility, Fascial restriction, Coordination, GMC, UE functional use, Pain, FMC, Strength, Flexibility       Visit Diagnosis: Other lack of coordination  Stiffness of right wrist joint  Other symptoms and signs involving the musculoskeletal system  Pain in right wrist    Problem List There are no active problems to display for this patient.  Guadelupe Sabin, OTR/L  [phone] 07/04/2019, 11:39 AM  [facility] [address] [place], [place], [address] Phone: [phone]   Fax:  [phone]  Name: ADLINE KIRSHENBAUM MRN: [id number] Date of Birth: [birth year]

## 2019-07-09 ENCOUNTER — Encounter (HOSPITAL_COMMUNITY): Payer: PRIVATE HEALTH INSURANCE

## 2019-07-09 DIAGNOSIS — L821 Other seborrheic keratosis: Secondary | ICD-10-CM | POA: Diagnosis not present

## 2019-07-09 DIAGNOSIS — Z85828 Personal history of other malignant neoplasm of skin: Secondary | ICD-10-CM | POA: Diagnosis not present

## 2019-07-09 DIAGNOSIS — D485 Neoplasm of uncertain behavior of skin: Secondary | ICD-10-CM | POA: Diagnosis not present

## 2019-07-11 ENCOUNTER — Encounter (HOSPITAL_COMMUNITY): Payer: Self-pay

## 2019-07-11 ENCOUNTER — Other Ambulatory Visit: Payer: Self-pay

## 2019-07-11 ENCOUNTER — Ambulatory Visit (HOSPITAL_COMMUNITY): Payer: Medicare Other

## 2019-07-11 DIAGNOSIS — R278 Other lack of coordination: Secondary | ICD-10-CM | POA: Diagnosis not present

## 2019-07-11 DIAGNOSIS — M25631 Stiffness of right wrist, not elsewhere classified: Secondary | ICD-10-CM | POA: Diagnosis not present

## 2019-07-11 DIAGNOSIS — R600 Localized edema: Secondary | ICD-10-CM

## 2019-07-11 DIAGNOSIS — R29898 Other symptoms and signs involving the musculoskeletal system: Secondary | ICD-10-CM

## 2019-07-11 DIAGNOSIS — M25531 Pain in right wrist: Secondary | ICD-10-CM | POA: Diagnosis not present

## 2019-07-11 NOTE — Therapy (Signed)
[place], [place], [address] Phone: [phone]   Fax:  [phone]  Occupational Therapy Treatment  Patient Details  Name: Madeline Horton MRN: [id number] Date of Birth: 1956/03/31 Referring Provider (OT): Les Pou, DO   Encounter Date: 07/11/2019  OT End of Session - 07/11/19 1108    Visit Number  11    Number of Visits  16    Date for OT Re-Evaluation  07/27/19    Authorization Type  1) [employer] 2) [employer]    Authorization Time Period  progress note at visit 20.    Authorization - Visit Number  11    Authorization - Number of Visits  20    OT Start Time  6962    OT Stop Time  1110    OT Time Calculation (min)  38 min    Activity Tolerance  Patient tolerated treatment well    Behavior During Therapy  WFL for tasks assessed/performed       History reviewed. No pertinent past medical history.  Past Surgical History:  Procedure Laterality Date  . BREAST EXCISIONAL BIOPSY Left    benign    There were no vitals filed for this visit.  Subjective Assessment - 07/11/19 1106    Subjective   S: I went grocery shopping myself yesterday and I was able to grab a gallon on milk by the handle and pick it up with the left hand stabilizing underneith.         [facility] OT Assessment - 07/11/19 1047      Assessment   Medical Diagnosis  Right ORIF Wrist      Precautions   Precautions  Other (comment)    Precaution Comments  Progress as tolerated.       Coordination   9 Hole Peg Test  Right    Right 9 Hole Peg Test  22.07"   previous: 24.9"              OT Treatments/Exercises (OP) - 07/11/19 1050      Exercises   Exercises  Wrist;Hand;Theraputty      Weighted Stretch Over Towel Roll   Wrist Flexion - Weighted Stretch  2 pounds;60 seconds   2 sets     Wrist Exercises   Wrist Flexion  Strengthening   12X   Bar Weights/Barbell (Wrist Flexion)  1 lb    Wrist  Extension  Strengthening   12X   Bar Weights/Barbell (Wrist Extension)  1 lb    Wrist Radial Deviation  Strengthening   12X   Bar Weights/Barbell (Radial Deviation)  1 lb    Wrist Ulnar Deviation  Strengthening   12X   Bar Weights/Barbell (Ulnar Deviation)  1 lb      Additional Wrist Exercises   Hand Gripper with Large Beads  all beads with gripper set at 29#    Hand Gripper with Medium Beads  all beads with gripper set at 29#    Hand Gripper with Small Beads  all beads with gripper set at 29#      Modalities   Modalities  Moist Heat;Paraffin      Moist Heat Therapy   Number Minutes Moist Heat  5 Minutes    Moist Heat Location  Wrist;Hand      RUE Paraffin   Number Minutes Paraffin  5 Minutes    RUE Paraffin Location  Wrist   hand  OT Short Term Goals - 07/04/19 1111      OT SHORT TERM GOAL #1   Title  Patient will be educated and independent with HEP in order to faciliate her progress in therapy and allow her to return to using her RUE for her daily and leisure tasks.    Time  3    Period  Weeks    Status  On-going    Target Date  06/25/19      OT SHORT TERM GOAL #2   Title  Patient will demonstrate P/ROM WFL in order to begin using her Right hand as her dominant hand for 50% of daily tasks.    Time  3    Period  Weeks    Status  Partially Met        OT Long Term Goals - 07/11/19 1049      OT LONG TERM GOAL #1   Title  Patient will reach her highest level of independence with all daily and leisure tasks while using her RUE as her dominant extremity for 75% or more daily tasks.    Time  6    Period  Weeks    Status  On-going      OT LONG TERM GOAL #2   Title  Pt will increase her Right wrist and hand A/ROM to [facility] in order to return to her leisure tasks such as sewing.    Time  6    Period  Weeks    Status  On-going      OT LONG TERM GOAL #3   Title  Patient will increase her right grip strength by 25# and her pinch strength by 8# in  order to hold onto small items without dropping them.    Baseline  10/14: revised grip strength to reflect pt's current functioning; has not met pinch strength as of 10/14    Time  6    Period  Weeks    Status  On-going      OT LONG TERM GOAL #4   Title  Patient will increase her right wrist strength to 5/5 in order to complete basic daily tasks such as grooming, dressing, and bathing with decreased difficulty.    Baseline  10/14: revised to reflect pt's current functional level    Time  6    Period  Weeks    Status  On-going      OT LONG TERM GOAL #5   Title  patient will increase her right hand coordination by completing the 9 hole peg test in 22 seconds or less.    Time  6    Period  Weeks    Status  Achieved            Plan - 07/11/19 1114    Clinical Impression Statement  A: Pt met coordination goal this session as she was able to complete the 9 hole peg test in 22 seconds. Scheduled her for 2 additional weeks of therapy as per reassessment recommendations. Reviewed recommendation from last session. Added wrist strengthening with 1# hand weight. VC for form and technique were provided.    Body Structure / Function / Physical Skills  ADL;Dexterity;ROM;IADL;Sensation;Mobility;Fascial restriction;Coordination;GMC;UE functional use;Pain;FMC;Strength;Flexibility    Plan  P: Continue with wrist strengthening. Complete pinch strengthening with clothespins.    Consulted and Agree with Plan of Care  Patient       Patient will benefit from skilled therapeutic intervention in order to improve the following deficits and impairments:  Body Structure / Function / Physical Skills: ADL, Dexterity, ROM, IADL, Sensation, Mobility, Fascial restriction, Coordination, GMC, UE functional use, Pain, FMC, Strength, Flexibility       Visit Diagnosis: Other lack of coordination  Other symptoms and signs involving the musculoskeletal system  Pain in right wrist  Localized edema  Stiffness  of right wrist joint    Problem List There are no active problems to display for this patient.  Ailene Ravel, OTR/L,CBIS  [phone]  07/11/2019, 11:17 AM  [place], [place], [address] Phone: [phone]   Fax:  [phone]  Name: Madeline Horton MRN: [id number] Date of Birth: [birth year]

## 2019-07-12 ENCOUNTER — Ambulatory Visit (HOSPITAL_COMMUNITY): Payer: Medicare Other

## 2019-07-12 ENCOUNTER — Encounter (HOSPITAL_COMMUNITY): Payer: Self-pay

## 2019-07-12 DIAGNOSIS — R278 Other lack of coordination: Secondary | ICD-10-CM | POA: Diagnosis not present

## 2019-07-12 DIAGNOSIS — M25531 Pain in right wrist: Secondary | ICD-10-CM

## 2019-07-12 DIAGNOSIS — R600 Localized edema: Secondary | ICD-10-CM

## 2019-07-12 DIAGNOSIS — R29898 Other symptoms and signs involving the musculoskeletal system: Secondary | ICD-10-CM

## 2019-07-12 DIAGNOSIS — M25631 Stiffness of right wrist, not elsewhere classified: Secondary | ICD-10-CM | POA: Diagnosis not present

## 2019-07-12 NOTE — Patient Instructions (Signed)
Complete 12-15 reps. 1-2 times a day.    WRIST CURLS - RADIAL DEVIATION - FREE WEIGHT - THIGH  While holding a small dumbbell and resting your forearm on your thigh, bend your wrist up and down with your wrist in a neutral position as shown.    WRIST EXTENSION CURLS - FREE WEIGHT - THIGH  While holding a small dumbbell, place your forearm on your thigh and bend your wrist up and down with your palm face down as shown.    WRIST CURLS - FREE WEIGHT - THIGH  While holding a small dumbbell and resting your forearm on your thigh, bend your wrist up and down with your palm face up as shown.      Wrist Supination  Supporting forearm on a table and with palm facing down, rotate the wrist to palm up position.  Maintain forearm contact with the table.

## 2019-07-12 NOTE — Therapy (Signed)
[place], [place], [address] Phone: [phone]   Fax:  [phone]  Occupational Therapy Treatment  Patient Details  Name: Madeline Horton MRN: [id number] Date of Birth: 24-Nov-1955 Referring Provider (OT): Les Pou, DO   Encounter Date: 07/12/2019  OT End of Session - 07/12/19 1455    Visit Number  12    Number of Visits  16    Date for OT Re-Evaluation  07/27/19    Authorization Type  1) [employer] 2) Eulis Foster    Authorization Time Period  progress note at visit 20.    Authorization - Visit Number  12    Authorization - Number of Visits  20    OT Start Time  1430    OT Stop Time  1509    OT Time Calculation (min)  39 min    Activity Tolerance  Patient tolerated treatment well    Behavior During Therapy  WFL for tasks assessed/performed       History reviewed. No pertinent past medical history.  Past Surgical History:  Procedure Laterality Date  . BREAST EXCISIONAL BIOPSY Left    benign    There were no vitals filed for this visit.  Subjective Assessment - 07/12/19 1443    Subjective   S: It got a workout from yesterday.    Currently in Pain?  No/denies         [facility] OT Assessment - 07/12/19 1443      Assessment   Medical Diagnosis  Right ORIF Wrist      Precautions   Precautions  Other (comment)    Precaution Comments  Progress as tolerated.                OT Treatments/Exercises (OP) - 07/12/19 1443      Exercises   Exercises  Wrist;Hand;Theraputty      Wrist Exercises   Wrist Flexion  Strengthening   12X   Bar Weights/Barbell (Wrist Flexion)  1 lb    Wrist Extension  Strengthening   12X   Bar Weights/Barbell (Wrist Extension)  1 lb    Wrist Radial Deviation  Strengthening   12X   Bar Weights/Barbell (Radial Deviation)  1 lb    Wrist Ulnar Deviation  Strengthening   12X   Bar Weights/Barbell (Ulnar Deviation)  1 lb      Additional Wrist  Exercises   Sponges  Utilized red resistive clothespins and a 3 point point pinch to place 30 sponges on top of container lid.       Hand Exercises   Other Hand Exercises  Wrist A/ROM with small pink ball on table top    Other Hand Exercises  Using the red resistive clothespin and a 3 point pinch, patient stacked sponges into 5 high.       Theraputty   Theraputty Hand- Locate Pegs  8/8 beads located. yellow      Modalities   Modalities  Moist Heat;Paraffin      Moist Heat Therapy   Number Minutes Moist Heat  5 Minutes    Moist Heat Location  Wrist;Hand      RUE Paraffin   Number Minutes Paraffin  5 Minutes    RUE Paraffin Location  Wrist   hand            OT Education - 07/12/19 1442    Education Details  wrist strengthening exercises (1#)    Person(s) Educated  Patient  Methods  Explanation;Demonstration;Verbal cues;Handout    Comprehension  Returned demonstration;Verbalized understanding       OT Short Term Goals - 07/04/19 1111      OT SHORT TERM GOAL #1   Title  Patient will be educated and independent with HEP in order to faciliate her progress in therapy and allow her to return to using her RUE for her daily and leisure tasks.    Time  3    Period  Weeks    Status  On-going    Target Date  06/25/19      OT SHORT TERM GOAL #2   Title  Patient will demonstrate P/ROM WFL in order to begin using her Right hand as her dominant hand for 50% of daily tasks.    Time  3    Period  Weeks    Status  Partially Met        OT Long Term Goals - 07/12/19 1548      OT LONG TERM GOAL #1   Title  Patient will reach her highest level of independence with all daily and leisure tasks while using her RUE as her dominant extremity for 75% or more daily tasks.    Time  6    Period  Weeks    Status  On-going      OT LONG TERM GOAL #2   Title  Pt will increase her Right wrist and hand A/ROM to [facility] in order to return to her leisure tasks such as sewing.    Time  6     Period  Weeks    Status  On-going      OT LONG TERM GOAL #3   Title  Patient will increase her right grip strength by 25# and her pinch strength by 8# in order to hold onto small items without dropping them.    Baseline  10/14: revised grip strength to reflect pt's current functioning; has not met pinch strength as of 10/14    Time  6    Period  Weeks    Status  On-going      OT LONG TERM GOAL #4   Title  Patient will increase her right wrist strength to 5/5 in order to complete basic daily tasks such as grooming, dressing, and bathing with decreased difficulty.    Baseline  10/14: revised to reflect pt's current functional level    Time  6    Period  Weeks    Status  On-going      OT LONG TERM GOAL #5   Title  patient will increase her right hand coordination by completing the 9 hole peg test in 22 seconds or less.    Time  6    Period  Weeks            Plan - 07/12/19 1546    Clinical Impression Statement  A: Focused on increasing wrist of motion during A/ROM with ball. Continued with wrist strengthening and provided for HEP. VC for form and technique were provided.    Body Structure / Function / Physical Skills  ADL;Dexterity;ROM;IADL;Sensation;Mobility;Fascial restriction;Coordination;GMC;UE functional use;Pain;FMC;Strength;Flexibility    Plan  P: Continue to work on increasing ROM on wrist to increase functional use.       Patient will benefit from skilled therapeutic intervention in order to improve the following deficits and impairments:   Body Structure / Function / Physical Skills: ADL, Dexterity, ROM, IADL, Sensation, Mobility, Fascial restriction, Coordination, GMC, UE functional use, Pain, FMC,  Strength, Flexibility       Visit Diagnosis: Other symptoms and signs involving the musculoskeletal system  Pain in right wrist  Other lack of coordination  Localized edema  Stiffness of right wrist joint    Problem List There are no active problems to  display for this patient.  Ailene Ravel, OTR/L,CBIS  [phone]  07/12/2019, 3:48 PM  [facility] [address] [place], [place], [address] Phone: [phone]   Fax:  [phone]  Name: DYANNA SEITER MRN: [id number] Date of Birth: Jul 29, 1956

## 2019-07-13 ENCOUNTER — Encounter (HOSPITAL_COMMUNITY): Payer: PRIVATE HEALTH INSURANCE

## 2019-07-18 ENCOUNTER — Encounter (HOSPITAL_COMMUNITY): Payer: Self-pay

## 2019-07-18 ENCOUNTER — Other Ambulatory Visit: Payer: Self-pay

## 2019-07-18 ENCOUNTER — Ambulatory Visit (HOSPITAL_COMMUNITY): Payer: Medicare Other

## 2019-07-18 DIAGNOSIS — R29898 Other symptoms and signs involving the musculoskeletal system: Secondary | ICD-10-CM

## 2019-07-18 DIAGNOSIS — R278 Other lack of coordination: Secondary | ICD-10-CM

## 2019-07-18 DIAGNOSIS — R600 Localized edema: Secondary | ICD-10-CM

## 2019-07-18 DIAGNOSIS — M25531 Pain in right wrist: Secondary | ICD-10-CM

## 2019-07-18 DIAGNOSIS — M25631 Stiffness of right wrist, not elsewhere classified: Secondary | ICD-10-CM | POA: Diagnosis not present

## 2019-07-18 NOTE — Therapy (Signed)
[place], [place], [address] Phone: [phone]   Fax:  [phone]  Occupational Therapy Treatment  Patient Details  Name: Madeline Horton MRN: [id number] Date of Birth: January 13, 1956 Referring Provider (OT): Les Pou, DO   Encounter Date: 07/18/2019  OT End of Session - 07/18/19 1646    Visit Number  13    Number of Visits  16    Date for OT Re-Evaluation  07/27/19    Authorization Type  1) [employer] 2) [employer]ulis Foster    Authorization Time Period  progress note at visit 20.    Authorization - Visit Number  13    Authorization - Number of Visits  20    OT Start Time  4627    OT Stop Time  1643    OT Time Calculation (min)  39 min    Activity Tolerance  Patient tolerated treatment well    Behavior During Therapy  WFL for tasks assessed/performed       History reviewed. No pertinent past medical history.  Past Surgical History:  Procedure Laterality Date  . BREAST EXCISIONAL BIOPSY Left    benign    There were no vitals filed for this visit.  Subjective Assessment - 07/18/19 1634    Subjective   S: I was able to start the car and shift the gears with this hand.    Currently in Pain?  No/denies         [facility] OT Assessment - 07/18/19 1651      Assessment   Medical Diagnosis  Right ORIF Wrist      Precautions   Precautions  Other (comment)    Precaution Comments  Progress as tolerated.                OT Treatments/Exercises (OP) - 07/18/19 1635      Exercises   Exercises  Wrist;Hand;Theraputty      Wrist Exercises   Wrist Flexion  PROM;AROM;10 reps    Wrist Extension  PROM;AROM;10 reps    Other wrist exercises  Wrist extension stretch; 2 sets; 20 seconds      Hand Exercises   Other Hand Exercises  Wrist A/ROM with small pink ball on table top    Other Hand Exercises  utilized pvc pipe to cut circles into yellow putty focusing on wrist flexion and extension.        Theraputty   Theraputty - Flatten  yellow - seated      Modalities   Modalities  Moist Heat;Paraffin      Moist Heat Therapy   Number Minutes Moist Heat  5 Minutes    Moist Heat Location  Wrist;Hand      RUE Paraffin   Number Minutes Paraffin  5 Minutes    RUE Paraffin Location  Wrist   hand              OT Short Term Goals - 07/04/19 1111      OT SHORT TERM GOAL #1   Title  Patient will be educated and independent with HEP in order to faciliate her progress in therapy and allow her to return to using her RUE for her daily and leisure tasks.    Time  3    Period  Weeks    Status  On-going    Target Date  06/25/19      OT SHORT TERM GOAL #2   Title  Patient will demonstrate P/ROM [facility]  in order to begin using her Right hand as her dominant hand for 50% of daily tasks.    Time  3    Period  Weeks    Status  Partially Met        OT Long Term Goals - 07/12/19 1548      OT LONG TERM GOAL #1   Title  Patient will reach her highest level of independence with all daily and leisure tasks while using her RUE as her dominant extremity for 75% or more daily tasks.    Time  6    Period  Weeks    Status  On-going      OT LONG TERM GOAL #2   Title  Pt will increase her Right wrist and hand A/ROM to [facility] in order to return to her leisure tasks such as sewing.    Time  6    Period  Weeks    Status  On-going      OT LONG TERM GOAL #3   Title  Patient will increase her right grip strength by 25# and her pinch strength by 8# in order to hold onto small items without dropping them.    Baseline  10/14: revised grip strength to reflect pt's current functioning; has not met pinch strength as of 10/14    Time  6    Period  Weeks    Status  On-going      OT LONG TERM GOAL #4   Title  Patient will increase her right wrist strength to 5/5 in order to complete basic daily tasks such as grooming, dressing, and bathing with decreased difficulty.    Baseline  10/14: revised to  reflect pt's current functional level    Time  6    Period  Weeks    Status  On-going      OT LONG TERM GOAL #5   Title  patient will increase her right hand coordination by completing the 9 hole peg test in 22 seconds or less.    Time  6    Period  Weeks            Plan - 07/18/19 1647    Clinical Impression Statement  A: Continued to focus on increasing ROM during wrist flexion and extension while resuming passive stretching. Patient reports some pain anterior lateral region of the right near distal and proximal wrist crease. VC for form and technique were provided.    Body Structure / Function / Physical Skills  ADL;Dexterity;ROM;IADL;Sensation;Mobility;Fascial restriction;Coordination;GMC;UE functional use;Pain;FMC;Strength;Flexibility    Plan  P: Continue to work on increasing ROM on wrist to increase functional use.    Consulted and Agree with Plan of Care  Patient       Patient will benefit from skilled therapeutic intervention in order to improve the following deficits and impairments:   Body Structure / Function / Physical Skills: ADL, Dexterity, ROM, IADL, Sensation, Mobility, Fascial restriction, Coordination, GMC, UE functional use, Pain, FMC, Strength, Flexibility       Visit Diagnosis: Other symptoms and signs involving the musculoskeletal system  Pain in right wrist  Other lack of coordination  Localized edema  Stiffness of right wrist joint    Problem List There are no active problems to display for this patient.  Ailene Ravel, OTR/L,CBIS  [phone]  07/18/2019, 4:51 PM  [facility] [address] [place], [place], [address] Phone: [phone]   Fax:  [phone]  Name: Madeline Horton  MRN: [id number] Date of Birth: 03-08-1956

## 2019-07-20 DIAGNOSIS — D0461 Carcinoma in situ of skin of right upper limb, including shoulder: Secondary | ICD-10-CM | POA: Diagnosis not present

## 2019-07-20 DIAGNOSIS — D485 Neoplasm of uncertain behavior of skin: Secondary | ICD-10-CM | POA: Diagnosis not present

## 2019-07-24 ENCOUNTER — Encounter (HOSPITAL_COMMUNITY): Payer: Self-pay

## 2019-07-24 ENCOUNTER — Other Ambulatory Visit: Payer: Self-pay

## 2019-07-24 ENCOUNTER — Ambulatory Visit (HOSPITAL_COMMUNITY): Payer: Medicare Other | Attending: Family Medicine

## 2019-07-24 DIAGNOSIS — R29898 Other symptoms and signs involving the musculoskeletal system: Secondary | ICD-10-CM

## 2019-07-24 DIAGNOSIS — M25631 Stiffness of right wrist, not elsewhere classified: Secondary | ICD-10-CM | POA: Insufficient documentation

## 2019-07-24 DIAGNOSIS — R600 Localized edema: Secondary | ICD-10-CM | POA: Diagnosis not present

## 2019-07-24 DIAGNOSIS — M25531 Pain in right wrist: Secondary | ICD-10-CM | POA: Diagnosis not present

## 2019-07-24 DIAGNOSIS — R278 Other lack of coordination: Secondary | ICD-10-CM | POA: Insufficient documentation

## 2019-07-24 NOTE — Therapy (Signed)
[place], [place], [address] Phone: [phone]   Fax:  [phone]  Occupational Therapy Treatment  Patient Details  Name: Madeline Horton MRN: [id number] Date of Birth: 1955-09-25 Referring Provider (OT): Les Pou, DO   Encounter Date: 07/24/2019  OT End of Session - 07/24/19 0930    Visit Number  14    Number of Visits  16    Date for OT Re-Evaluation  07/27/19    Authorization Type  1) [employer] 2) [employer]ulis Foster    Authorization Time Period  progress note at visit 20.    Authorization - Visit Number  14    Authorization - Number of Visits  20    OT Start Time  [phone]    OT Stop Time  0941    OT Time Calculation (min)  38 min    Activity Tolerance  Patient tolerated treatment well    Behavior During Therapy  [facility] for tasks assessed/performed       History reviewed. No pertinent past medical history.  Past Surgical History:  Procedure Laterality Date  . BREAST EXCISIONAL BIOPSY Left    benign    There were no vitals filed for this visit.  Subjective Assessment - 07/24/19 0922    Subjective   S: I shocked myself this weekend. I picked up a coffe cup with this hand without even thinking about it.    Currently in Pain?  No/denies         [facility] OT Assessment - 07/24/19 4193      Assessment   Medical Diagnosis  Right ORIF Wrist      Precautions   Precautions  Other (comment)    Precaution Comments  Progress as tolerated.                OT Treatments/Exercises (OP) - 07/24/19 0922      Exercises   Exercises  Wrist;Hand;Theraputty      Wrist Exercises   Wrist Flexion  PROM;AROM;10 reps    Wrist Extension  PROM;AROM;10 reps    Other wrist exercises  Wrist extension stretch; 2 sets; 20 seconds      Additional Wrist Exercises   Sponges  Utilized green resistive clothespins and a 3 point point pinch to place 30 sponges on top of container lid.     Hand Gripper  with Large Beads  all beads with gripper set at 35#   horizontal   Hand Gripper with Medium Beads  all beads with gripper set at 35#   horizontal   Hand Gripper with Small Beads  all beads with gripper set at 35#   horizontal     Modalities   Modalities  Moist Heat;Paraffin      Moist Heat Therapy   Number Minutes Moist Heat  5 Minutes    Moist Heat Location  Wrist;Hand      RUE Paraffin   Number Minutes Paraffin  5 Minutes    RUE Paraffin Location  Wrist   hand              OT Short Term Goals - 07/04/19 1111      OT [place] #1   Title  Patient will be educated and independent with HEP in order to faciliate her progress in therapy and allow her to return to using her RUE for her daily and leisure tasks.    Time  3    Period  Weeks  Status  On-going    Target Date  06/25/19      OT SHORT TERM GOAL #2   Title  Patient will demonstrate P/ROM WFL in order to begin using her Right hand as her dominant hand for 50% of daily tasks.    Time  3    Period  Weeks    Status  Partially Met        OT Long Term Goals - 07/12/19 1548      OT LONG TERM GOAL #1   Title  Patient will reach her highest level of independence with all daily and leisure tasks while using her RUE as her dominant extremity for 75% or more daily tasks.    Time  6    Period  Weeks    Status  On-going      OT LONG TERM GOAL #2   Title  Pt will increase her Right wrist and hand A/ROM to [facility] in order to return to her leisure tasks such as sewing.    Time  6    Period  Weeks    Status  On-going      OT LONG TERM GOAL #3   Title  Patient will increase her right grip strength by 25# and her pinch strength by 8# in order to hold onto small items without dropping them.    Baseline  10/14: revised grip strength to reflect pt's current functioning; has not met pinch strength as of 10/14    Time  6    Period  Weeks    Status  On-going      OT LONG TERM GOAL #4   Title  Patient will increase  her right wrist strength to 5/5 in order to complete basic daily tasks such as grooming, dressing, and bathing with decreased difficulty.    Baseline  10/14: revised to reflect pt's current functional level    Time  6    Period  Weeks    Status  On-going      OT LONG TERM GOAL #5   Title  patient will increase her right hand coordination by completing the 9 hole peg test in 22 seconds or less.    Time  6    Period  Weeks            Plan - 07/24/19 0931    Clinical Impression Statement  A: Pt was able to increase handgripper strength to 35# this session. Also progressed up to green clothespin for pinch strengthening task. Small pop felt during passive wrist flexion stretching. Pt continues to be limited with her wrist ROM  during flexion and extension and is only able to tolerate so much. VC for form and technique were provided.    Body Structure / Function / Physical Skills  ADL;Dexterity;ROM;IADL;Sensation;Mobility;Fascial restriction;Coordination;GMC;UE functional use;Pain;FMC;Strength;Flexibility    Plan  P: Reassessment and discharge. Update HEP if needed.    Consulted and Agree with Plan of Care  Patient       Patient will benefit from skilled therapeutic intervention in order to improve the following deficits and impairments:   Body Structure / Function / Physical Skills: ADL, Dexterity, ROM, IADL, Sensation, Mobility, Fascial restriction, Coordination, GMC, UE functional use, Pain, FMC, Strength, Flexibility       Visit Diagnosis: Other lack of coordination  Pain in right wrist  Other symptoms and signs involving the musculoskeletal system  Localized edema  Stiffness of right wrist joint    Problem List There are no  active problems to display for this patient.  Ailene Ravel, OTR/L,CBIS  [phone]  07/24/2019, 9:47 AM  [facility] [address] [place], [place], [address] Phone: [phone]   Fax:   [phone]  Name: Madeline Horton MRN: [id number] Date of Birth: 13-Oct-1955

## 2019-07-26 ENCOUNTER — Encounter (HOSPITAL_COMMUNITY): Payer: Self-pay

## 2019-07-26 ENCOUNTER — Other Ambulatory Visit: Payer: Self-pay

## 2019-07-26 ENCOUNTER — Ambulatory Visit (HOSPITAL_COMMUNITY): Payer: Medicare Other

## 2019-07-26 DIAGNOSIS — R29898 Other symptoms and signs involving the musculoskeletal system: Secondary | ICD-10-CM | POA: Diagnosis not present

## 2019-07-26 DIAGNOSIS — R278 Other lack of coordination: Secondary | ICD-10-CM

## 2019-07-26 DIAGNOSIS — R600 Localized edema: Secondary | ICD-10-CM | POA: Diagnosis not present

## 2019-07-26 DIAGNOSIS — M25531 Pain in right wrist: Secondary | ICD-10-CM | POA: Diagnosis not present

## 2019-07-26 DIAGNOSIS — M25631 Stiffness of right wrist, not elsewhere classified: Secondary | ICD-10-CM

## 2019-07-26 NOTE — Therapy (Signed)
[place], [place], [address] Phone: [phone]   Fax:  [phone]  Occupational Therapy Treatment Reassessment/discharge Patient Details  Name: Madeline Horton MRN: [id number] Date of Birth: 08/03/1956 Referring Provider (OT): Les Pou, DO   Encounter Date: 07/26/2019  OT End of Session - 07/26/19 1250    Visit Number  15    Number of Visits  16    Authorization Type  1) [employer] 2) Eulis Foster    Authorization Time Period  progress note at visit 20.    Authorization - Visit Number  15    Authorization - Number of Visits  20    OT Start Time  [phone]   reassess and discharge   OT Stop Time  0941    OT Time Calculation (min)  36 min    Activity Tolerance  Patient tolerated treatment well    Behavior During Therapy  [facility] for tasks assessed/performed       History reviewed. No pertinent past medical history.  Past Surgical History:  Procedure Laterality Date  . BREAST EXCISIONAL BIOPSY Left    benign    There were no vitals filed for this visit.  Subjective Assessment - 07/26/19 1249    Subjective   S: It just feels stiff. No pain. It's becoming more natural to use it now.    Currently in Pain?  No/denies         [facility] OT Assessment - 07/26/19 0917      Assessment   Medical Diagnosis  Right ORIF Wrist      Precautions   Precautions  Other (comment)    Precaution Comments  Progress as tolerated.       Coordination   9 Hole Peg Test  Right    Right 9 Hole Peg Test  20.7"   previous: 22.7"     ROM / Strength   AROM / PROM / Strength  AROM;Strength;PROM      AROM   Overall AROM Comments  Assessed seated    AROM Assessment Site  Wrist    Right/Left Wrist  Right    Right Wrist Extension  58 Degrees   previous: 40   Right Wrist Flexion  62 Degrees   previous: 40   Right Wrist Radial Deviation  24 Degrees   previous: 20   Right Wrist Ulnar Deviation  25 Degrees    previous: same     Strength   Strength Assessment Site  Wrist;Hand    Right/Left Wrist  Right    Right Wrist Flexion  5/5   previous: 4/5   Right Wrist Extension  5/5   previous: 4+/5   Right Wrist Radial Deviation  5/5   previous: 4/5   Right Wrist Ulnar Deviation  5/5   previous: 4/5   Right/Left hand  Right    Right Hand Grip (lbs)  26   previous: 21   Right Hand Lateral Pinch  12 lbs   previous: 16   Right Hand 3 Point Pinch  8 lbs   previous: 6        Quick Dash - 07/26/19 7681    Open a tight or new jar  Mild difficulty    Do heavy household chores (wash walls, wash floors)  Moderate difficulty    Carry a shopping bag or briefcase  No difficulty    Wash your back  No difficulty    Use a knife to cut food  Moderate difficulty    Recreational activities in which you take some force or impact through your arm, shoulder, or hand (golf, hammering, tennis)  Moderate difficulty    During the past week, to what extent has your arm, shoulder or hand problem interfered with your normal social activities with family, friends, neighbors, or groups?  Not at all    During the past week, to what extent has your arm, shoulder or hand problem limited your work or other regular daily activities  Not at all    Arm, shoulder, or hand pain.  None    Tingling (pins and needles) in your arm, shoulder, or hand  None    Difficulty Sleeping  No difficulty    DASH Score  15.91 %                   OT Education - 07/26/19 1249    Education Details  reviewed HEP and recommended continuing all exercises except paper crinkle, and towel roll squeeze, thumb adduction/abduction, and composite adduction/abduction. Provided with red putty to progress at home. Recommended focusing on lateral pinch and grip as well as wrist flexion.    Person(s) Educated  Patient    Methods  Explanation;Demonstration;Verbal cues;Handout    Comprehension  Returned demonstration;Verbalized understanding        OT Short Term Goals - 07/26/19 0933      OT SHORT TERM GOAL #1   Title  Patient will be educated and independent with HEP in order to faciliate her progress in therapy and allow her to return to using her RUE for her daily and leisure tasks.    Time  3    Period  Weeks    Status  Achieved    Target Date  06/25/19      OT SHORT TERM GOAL #2   Title  Patient will demonstrate P/ROM WFL in order to begin using her Right hand as her dominant hand for 50% of daily tasks.    Time  3    Period  Weeks    Status  Achieved        OT Long Term Goals - 07/26/19 0934      OT LONG TERM GOAL #1   Title  Patient will reach her highest level of independence with all daily and leisure tasks while using her RUE as her dominant extremity for 75% or more daily tasks.    Time  6    Period  Weeks    Status  Achieved      OT LONG TERM GOAL #2   Title  Pt will increase her Right wrist and hand A/ROM to [facility] in order to return to her leisure tasks such as sewing.    Time  6    Period  Weeks    Status  Achieved      OT LONG TERM GOAL #3   Title  Patient will increase her right grip strength by 25# and her pinch strength by 8# in order to hold onto small items without dropping them.    Baseline  10/14: revised grip strength to reflect pt's current functioning; has not met pinch strength as of 10/14    Time  6    Period  Weeks    Status  Partially Met      OT LONG TERM GOAL #4   Title  Patient will increase her right wrist strength to 5/5 in order to complete basic daily tasks such as grooming, dressing,  and bathing with decreased difficulty.    Baseline  10/14: revised to reflect pt's current functional level    Time  6    Period  Weeks    Status  Achieved      OT LONG TERM GOAL #5   Title  patient will increase her right hand coordination by completing the 9 hole peg test in 22 seconds or less.    Time  6    Period  Weeks    Status  Achieved            Plan - 07/26/19 1251     Clinical Impression Statement  A: Reassessment completed this date. patient has met all therapy goals except her hand strength goal which she has partially met. patient continues to have deficits with ROM during wrist flexion and grip and pinch strength. Patient reports that she is using her hand as much as possible and it is becoming more natural and automatic. At this point, patient is able to continue focusing on the mentioned deficits at home independently with HEP. Pt is in agreement.    Body Structure / Function / Physical Skills  ADL;Dexterity;ROM;IADL;Sensation;Mobility;Fascial restriction;Coordination;GMC;UE functional use;Pain;FMC;Strength;Flexibility    Plan  P: Discharge with HEP.    Consulted and Agree with Plan of Care  Patient       Patient will benefit from skilled therapeutic intervention in order to improve the following deficits and impairments:   Body Structure / Function / Physical Skills: ADL, Dexterity, ROM, IADL, Sensation, Mobility, Fascial restriction, Coordination, GMC, UE functional use, Pain, FMC, Strength, Flexibility       Visit Diagnosis: Pain in right wrist  Other lack of coordination  Other symptoms and signs involving the musculoskeletal system  Localized edema  Stiffness of right wrist joint    Problem List There are no active problems to display for this patient.  OCCUPATIONAL THERAPY DISCHARGE SUMMARY  Visits from Start of Care: 15 Current functional level related to goals / functional outcomes: SEE ABOVE   Remaining deficits: A/ROM wrist, grip and pinch strength   Education / Equipment: See above Plan: Patient agrees to discharge.  Patient goals were met. Patient is being discharged due to meeting the stated rehab goals.  ?????         Ailene Ravel, OTR/L,CBIS  [phone]  07/26/2019, 12:54 PM  [place] [address] [place], [place], [address] Phone: [phone]   Fax:   [phone]  Name: Madeline Horton MRN: [id number] Date of Birth: Dec 16, 1955

## 2019-07-30 ENCOUNTER — Encounter (HOSPITAL_COMMUNITY): Payer: PRIVATE HEALTH INSURANCE

## 2019-08-01 ENCOUNTER — Encounter (HOSPITAL_COMMUNITY): Payer: PRIVATE HEALTH INSURANCE | Admitting: Occupational Therapy

## 2019-08-02 DIAGNOSIS — Z1159 Encounter for screening for other viral diseases: Secondary | ICD-10-CM | POA: Diagnosis not present

## 2019-08-07 DIAGNOSIS — K5730 Diverticulosis of large intestine without perforation or abscess without bleeding: Secondary | ICD-10-CM | POA: Diagnosis not present

## 2019-08-07 DIAGNOSIS — K640 First degree hemorrhoids: Secondary | ICD-10-CM | POA: Diagnosis not present

## 2019-08-07 DIAGNOSIS — K635 Polyp of colon: Secondary | ICD-10-CM | POA: Diagnosis not present

## 2019-08-07 DIAGNOSIS — Z1211 Encounter for screening for malignant neoplasm of colon: Secondary | ICD-10-CM | POA: Diagnosis not present

## 2019-08-09 DIAGNOSIS — L905 Scar conditions and fibrosis of skin: Secondary | ICD-10-CM | POA: Diagnosis not present

## 2019-08-09 DIAGNOSIS — D0461 Carcinoma in situ of skin of right upper limb, including shoulder: Secondary | ICD-10-CM | POA: Diagnosis not present

## 2019-08-09 DIAGNOSIS — L989 Disorder of the skin and subcutaneous tissue, unspecified: Secondary | ICD-10-CM | POA: Diagnosis not present

## 2019-08-10 DIAGNOSIS — K635 Polyp of colon: Secondary | ICD-10-CM | POA: Diagnosis not present

## 2019-08-21 DIAGNOSIS — Z4802 Encounter for removal of sutures: Secondary | ICD-10-CM | POA: Diagnosis not present

## 2019-08-21 DIAGNOSIS — L920 Granuloma annulare: Secondary | ICD-10-CM | POA: Diagnosis not present

## 2019-08-27 DIAGNOSIS — M810 Age-related osteoporosis without current pathological fracture: Secondary | ICD-10-CM | POA: Diagnosis not present

## 2019-11-09 DIAGNOSIS — M810 Age-related osteoporosis without current pathological fracture: Secondary | ICD-10-CM | POA: Diagnosis not present

## 2019-11-09 DIAGNOSIS — E039 Hypothyroidism, unspecified: Secondary | ICD-10-CM | POA: Diagnosis not present

## 2019-11-09 DIAGNOSIS — E782 Mixed hyperlipidemia: Secondary | ICD-10-CM | POA: Diagnosis not present

## 2019-11-09 DIAGNOSIS — F3340 Major depressive disorder, recurrent, in remission, unspecified: Secondary | ICD-10-CM | POA: Diagnosis not present

## 2019-11-09 DIAGNOSIS — K219 Gastro-esophageal reflux disease without esophagitis: Secondary | ICD-10-CM | POA: Diagnosis not present

## 2019-11-09 DIAGNOSIS — J309 Allergic rhinitis, unspecified: Secondary | ICD-10-CM | POA: Diagnosis not present

## 2019-11-13 DIAGNOSIS — J309 Allergic rhinitis, unspecified: Secondary | ICD-10-CM | POA: Diagnosis not present

## 2019-11-13 DIAGNOSIS — E782 Mixed hyperlipidemia: Secondary | ICD-10-CM | POA: Diagnosis not present

## 2019-11-13 DIAGNOSIS — M533 Sacrococcygeal disorders, not elsewhere classified: Secondary | ICD-10-CM | POA: Diagnosis not present

## 2019-11-13 DIAGNOSIS — F3340 Major depressive disorder, recurrent, in remission, unspecified: Secondary | ICD-10-CM | POA: Diagnosis not present

## 2019-11-13 DIAGNOSIS — K219 Gastro-esophageal reflux disease without esophagitis: Secondary | ICD-10-CM | POA: Diagnosis not present

## 2019-11-13 DIAGNOSIS — Z23 Encounter for immunization: Secondary | ICD-10-CM | POA: Diagnosis not present

## 2019-11-13 DIAGNOSIS — M810 Age-related osteoporosis without current pathological fracture: Secondary | ICD-10-CM | POA: Diagnosis not present

## 2019-11-13 DIAGNOSIS — E039 Hypothyroidism, unspecified: Secondary | ICD-10-CM | POA: Diagnosis not present

## 2019-11-13 DIAGNOSIS — Z0000 Encounter for general adult medical examination without abnormal findings: Secondary | ICD-10-CM | POA: Diagnosis not present

## 2019-11-22 DIAGNOSIS — D235 Other benign neoplasm of skin of trunk: Secondary | ICD-10-CM | POA: Diagnosis not present

## 2019-11-22 DIAGNOSIS — R208 Other disturbances of skin sensation: Secondary | ICD-10-CM | POA: Diagnosis not present

## 2019-11-22 DIAGNOSIS — D485 Neoplasm of uncertain behavior of skin: Secondary | ICD-10-CM | POA: Diagnosis not present

## 2019-11-22 DIAGNOSIS — L905 Scar conditions and fibrosis of skin: Secondary | ICD-10-CM | POA: Diagnosis not present

## 2019-11-27 DIAGNOSIS — L820 Inflamed seborrheic keratosis: Secondary | ICD-10-CM | POA: Diagnosis not present

## 2019-11-27 DIAGNOSIS — D485 Neoplasm of uncertain behavior of skin: Secondary | ICD-10-CM | POA: Diagnosis not present

## 2020-02-05 DIAGNOSIS — L570 Actinic keratosis: Secondary | ICD-10-CM | POA: Diagnosis not present

## 2020-02-05 DIAGNOSIS — M67471 Ganglion, right ankle and foot: Secondary | ICD-10-CM | POA: Diagnosis not present

## 2020-02-26 DIAGNOSIS — M810 Age-related osteoporosis without current pathological fracture: Secondary | ICD-10-CM | POA: Diagnosis not present

## 2020-05-01 ENCOUNTER — Other Ambulatory Visit: Payer: Self-pay | Admitting: Family Medicine

## 2020-05-01 DIAGNOSIS — Z1231 Encounter for screening mammogram for malignant neoplasm of breast: Secondary | ICD-10-CM

## 2020-07-01 DIAGNOSIS — Z23 Encounter for immunization: Secondary | ICD-10-CM | POA: Diagnosis not present

## 2020-07-04 ENCOUNTER — Other Ambulatory Visit: Payer: Self-pay

## 2020-07-04 ENCOUNTER — Ambulatory Visit
Admission: RE | Admit: 2020-07-04 | Discharge: 2020-07-04 | Disposition: A | Discharge status: Home / Self Care | Payer: Medicare Other | Source: Ambulatory Visit | Attending: Family Medicine | Admitting: Family Medicine

## 2020-07-04 DIAGNOSIS — Z1231 Encounter for screening mammogram for malignant neoplasm of breast: Secondary | ICD-10-CM | POA: Diagnosis not present

## 2020-07-14 DIAGNOSIS — L905 Scar conditions and fibrosis of skin: Secondary | ICD-10-CM | POA: Diagnosis not present

## 2020-07-14 DIAGNOSIS — D225 Melanocytic nevi of trunk: Secondary | ICD-10-CM | POA: Diagnosis not present

## 2020-07-14 DIAGNOSIS — Z85828 Personal history of other malignant neoplasm of skin: Secondary | ICD-10-CM | POA: Diagnosis not present

## 2020-07-14 DIAGNOSIS — I781 Nevus, non-neoplastic: Secondary | ICD-10-CM | POA: Diagnosis not present

## 2020-07-14 DIAGNOSIS — L814 Other melanin hyperpigmentation: Secondary | ICD-10-CM | POA: Diagnosis not present

## 2020-07-14 DIAGNOSIS — M67471 Ganglion, right ankle and foot: Secondary | ICD-10-CM | POA: Diagnosis not present

## 2020-08-28 DIAGNOSIS — M810 Age-related osteoporosis without current pathological fracture: Secondary | ICD-10-CM | POA: Diagnosis not present

## 2020-09-02 DIAGNOSIS — S39012A Strain of muscle, fascia and tendon of lower back, initial encounter: Secondary | ICD-10-CM | POA: Diagnosis not present

## 2020-11-05 DIAGNOSIS — M545 Low back pain, unspecified: Secondary | ICD-10-CM | POA: Diagnosis not present

## 2020-11-11 DIAGNOSIS — Z131 Encounter for screening for diabetes mellitus: Secondary | ICD-10-CM | POA: Diagnosis not present

## 2020-11-11 DIAGNOSIS — J309 Allergic rhinitis, unspecified: Secondary | ICD-10-CM | POA: Diagnosis not present

## 2020-11-11 DIAGNOSIS — F3340 Major depressive disorder, recurrent, in remission, unspecified: Secondary | ICD-10-CM | POA: Diagnosis not present

## 2020-11-11 DIAGNOSIS — M810 Age-related osteoporosis without current pathological fracture: Secondary | ICD-10-CM | POA: Diagnosis not present

## 2020-11-11 DIAGNOSIS — E782 Mixed hyperlipidemia: Secondary | ICD-10-CM | POA: Diagnosis not present

## 2020-11-11 DIAGNOSIS — K219 Gastro-esophageal reflux disease without esophagitis: Secondary | ICD-10-CM | POA: Diagnosis not present

## 2020-11-11 DIAGNOSIS — Z0000 Encounter for general adult medical examination without abnormal findings: Secondary | ICD-10-CM | POA: Diagnosis not present

## 2020-11-11 DIAGNOSIS — E039 Hypothyroidism, unspecified: Secondary | ICD-10-CM | POA: Diagnosis not present

## 2020-11-11 DIAGNOSIS — Z800 Family history of malignant neoplasm of digestive organs: Secondary | ICD-10-CM | POA: Diagnosis not present

## 2020-11-14 ENCOUNTER — Other Ambulatory Visit: Payer: Self-pay | Admitting: Family Medicine

## 2020-11-14 DIAGNOSIS — E039 Hypothyroidism, unspecified: Secondary | ICD-10-CM | POA: Diagnosis not present

## 2020-11-14 DIAGNOSIS — Z0000 Encounter for general adult medical examination without abnormal findings: Secondary | ICD-10-CM | POA: Diagnosis not present

## 2020-11-14 DIAGNOSIS — E782 Mixed hyperlipidemia: Secondary | ICD-10-CM | POA: Diagnosis not present

## 2020-11-14 DIAGNOSIS — M545 Low back pain, unspecified: Secondary | ICD-10-CM | POA: Diagnosis not present

## 2020-11-14 DIAGNOSIS — Z1389 Encounter for screening for other disorder: Secondary | ICD-10-CM | POA: Diagnosis not present

## 2020-11-14 DIAGNOSIS — R1013 Epigastric pain: Secondary | ICD-10-CM | POA: Diagnosis not present

## 2020-11-14 DIAGNOSIS — F411 Generalized anxiety disorder: Secondary | ICD-10-CM | POA: Diagnosis not present

## 2020-11-14 DIAGNOSIS — K219 Gastro-esophageal reflux disease without esophagitis: Secondary | ICD-10-CM | POA: Diagnosis not present

## 2020-11-14 DIAGNOSIS — K8020 Calculus of gallbladder without cholecystitis without obstruction: Secondary | ICD-10-CM

## 2020-11-14 DIAGNOSIS — Z01411 Encounter for gynecological examination (general) (routine) with abnormal findings: Secondary | ICD-10-CM | POA: Diagnosis not present

## 2020-11-14 DIAGNOSIS — M810 Age-related osteoporosis without current pathological fracture: Secondary | ICD-10-CM | POA: Diagnosis not present

## 2020-11-17 ENCOUNTER — Other Ambulatory Visit: Payer: Self-pay

## 2020-11-17 ENCOUNTER — Ambulatory Visit
Admission: RE | Admit: 2020-11-17 | Discharge: 2020-11-17 | Disposition: A | Discharge status: Home / Self Care | Payer: Medicare Other | Source: Ambulatory Visit | Attending: Family Medicine | Admitting: Family Medicine

## 2020-11-17 DIAGNOSIS — K760 Fatty (change of) liver, not elsewhere classified: Secondary | ICD-10-CM | POA: Diagnosis not present

## 2020-11-17 DIAGNOSIS — K8020 Calculus of gallbladder without cholecystitis without obstruction: Secondary | ICD-10-CM

## 2020-11-19 DIAGNOSIS — M5137 Other intervertebral disc degeneration, lumbosacral region: Secondary | ICD-10-CM | POA: Diagnosis not present

## 2020-11-19 DIAGNOSIS — M9903 Segmental and somatic dysfunction of lumbar region: Secondary | ICD-10-CM | POA: Diagnosis not present

## 2020-11-20 DIAGNOSIS — M9903 Segmental and somatic dysfunction of lumbar region: Secondary | ICD-10-CM | POA: Diagnosis not present

## 2020-11-20 DIAGNOSIS — M5137 Other intervertebral disc degeneration, lumbosacral region: Secondary | ICD-10-CM | POA: Diagnosis not present

## 2020-11-24 DIAGNOSIS — M9903 Segmental and somatic dysfunction of lumbar region: Secondary | ICD-10-CM | POA: Diagnosis not present

## 2020-11-24 DIAGNOSIS — M5137 Other intervertebral disc degeneration, lumbosacral region: Secondary | ICD-10-CM | POA: Diagnosis not present

## 2020-11-25 DIAGNOSIS — H5203 Hypermetropia, bilateral: Secondary | ICD-10-CM | POA: Diagnosis not present

## 2020-11-25 DIAGNOSIS — H2513 Age-related nuclear cataract, bilateral: Secondary | ICD-10-CM | POA: Diagnosis not present

## 2020-11-25 DIAGNOSIS — H52223 Regular astigmatism, bilateral: Secondary | ICD-10-CM | POA: Diagnosis not present

## 2020-11-25 DIAGNOSIS — H524 Presbyopia: Secondary | ICD-10-CM | POA: Diagnosis not present

## 2020-11-26 DIAGNOSIS — M9903 Segmental and somatic dysfunction of lumbar region: Secondary | ICD-10-CM | POA: Diagnosis not present

## 2020-11-26 DIAGNOSIS — M5137 Other intervertebral disc degeneration, lumbosacral region: Secondary | ICD-10-CM | POA: Diagnosis not present

## 2020-11-27 DIAGNOSIS — M5137 Other intervertebral disc degeneration, lumbosacral region: Secondary | ICD-10-CM | POA: Diagnosis not present

## 2020-11-27 DIAGNOSIS — M9903 Segmental and somatic dysfunction of lumbar region: Secondary | ICD-10-CM | POA: Diagnosis not present

## 2020-12-01 DIAGNOSIS — M5137 Other intervertebral disc degeneration, lumbosacral region: Secondary | ICD-10-CM | POA: Diagnosis not present

## 2020-12-01 DIAGNOSIS — M9903 Segmental and somatic dysfunction of lumbar region: Secondary | ICD-10-CM | POA: Diagnosis not present

## 2020-12-03 DIAGNOSIS — M9903 Segmental and somatic dysfunction of lumbar region: Secondary | ICD-10-CM | POA: Diagnosis not present

## 2020-12-03 DIAGNOSIS — M5137 Other intervertebral disc degeneration, lumbosacral region: Secondary | ICD-10-CM | POA: Diagnosis not present

## 2020-12-04 DIAGNOSIS — M5137 Other intervertebral disc degeneration, lumbosacral region: Secondary | ICD-10-CM | POA: Diagnosis not present

## 2020-12-04 DIAGNOSIS — M9903 Segmental and somatic dysfunction of lumbar region: Secondary | ICD-10-CM | POA: Diagnosis not present

## 2020-12-08 DIAGNOSIS — M5137 Other intervertebral disc degeneration, lumbosacral region: Secondary | ICD-10-CM | POA: Diagnosis not present

## 2020-12-08 DIAGNOSIS — M9903 Segmental and somatic dysfunction of lumbar region: Secondary | ICD-10-CM | POA: Diagnosis not present

## 2020-12-10 ENCOUNTER — Ambulatory Visit: Payer: Self-pay | Admitting: General Surgery

## 2020-12-10 DIAGNOSIS — M5137 Other intervertebral disc degeneration, lumbosacral region: Secondary | ICD-10-CM | POA: Diagnosis not present

## 2020-12-10 DIAGNOSIS — K8010 Calculus of gallbladder with chronic cholecystitis without obstruction: Secondary | ICD-10-CM | POA: Diagnosis not present

## 2020-12-10 DIAGNOSIS — M9903 Segmental and somatic dysfunction of lumbar region: Secondary | ICD-10-CM | POA: Diagnosis not present

## 2020-12-11 DIAGNOSIS — M9903 Segmental and somatic dysfunction of lumbar region: Secondary | ICD-10-CM | POA: Diagnosis not present

## 2020-12-11 DIAGNOSIS — M5137 Other intervertebral disc degeneration, lumbosacral region: Secondary | ICD-10-CM | POA: Diagnosis not present

## 2020-12-15 DIAGNOSIS — M9903 Segmental and somatic dysfunction of lumbar region: Secondary | ICD-10-CM | POA: Diagnosis not present

## 2020-12-15 DIAGNOSIS — M5137 Other intervertebral disc degeneration, lumbosacral region: Secondary | ICD-10-CM | POA: Diagnosis not present

## 2020-12-17 DIAGNOSIS — M5137 Other intervertebral disc degeneration, lumbosacral region: Secondary | ICD-10-CM | POA: Diagnosis not present

## 2020-12-17 DIAGNOSIS — M9903 Segmental and somatic dysfunction of lumbar region: Secondary | ICD-10-CM | POA: Diagnosis not present

## 2020-12-18 DIAGNOSIS — M9903 Segmental and somatic dysfunction of lumbar region: Secondary | ICD-10-CM | POA: Diagnosis not present

## 2020-12-18 DIAGNOSIS — M5137 Other intervertebral disc degeneration, lumbosacral region: Secondary | ICD-10-CM | POA: Diagnosis not present

## 2020-12-24 DIAGNOSIS — M9903 Segmental and somatic dysfunction of lumbar region: Secondary | ICD-10-CM | POA: Diagnosis not present

## 2020-12-24 DIAGNOSIS — M5137 Other intervertebral disc degeneration, lumbosacral region: Secondary | ICD-10-CM | POA: Diagnosis not present

## 2020-12-29 DIAGNOSIS — M9903 Segmental and somatic dysfunction of lumbar region: Secondary | ICD-10-CM | POA: Diagnosis not present

## 2020-12-29 DIAGNOSIS — M5137 Other intervertebral disc degeneration, lumbosacral region: Secondary | ICD-10-CM | POA: Diagnosis not present

## 2020-12-31 DIAGNOSIS — M9903 Segmental and somatic dysfunction of lumbar region: Secondary | ICD-10-CM | POA: Diagnosis not present

## 2020-12-31 DIAGNOSIS — M5137 Other intervertebral disc degeneration, lumbosacral region: Secondary | ICD-10-CM | POA: Diagnosis not present

## 2021-01-05 DIAGNOSIS — M9903 Segmental and somatic dysfunction of lumbar region: Secondary | ICD-10-CM | POA: Diagnosis not present

## 2021-01-05 DIAGNOSIS — M5137 Other intervertebral disc degeneration, lumbosacral region: Secondary | ICD-10-CM | POA: Diagnosis not present

## 2021-01-12 DIAGNOSIS — M5137 Other intervertebral disc degeneration, lumbosacral region: Secondary | ICD-10-CM | POA: Diagnosis not present

## 2021-01-12 DIAGNOSIS — M9903 Segmental and somatic dysfunction of lumbar region: Secondary | ICD-10-CM | POA: Diagnosis not present

## 2021-01-26 ENCOUNTER — Encounter (HOSPITAL_BASED_OUTPATIENT_CLINIC_OR_DEPARTMENT_OTHER): Payer: Self-pay | Admitting: General Surgery

## 2021-01-26 ENCOUNTER — Other Ambulatory Visit: Payer: Self-pay

## 2021-01-26 DIAGNOSIS — M5137 Other intervertebral disc degeneration, lumbosacral region: Secondary | ICD-10-CM | POA: Diagnosis not present

## 2021-01-26 DIAGNOSIS — M9903 Segmental and somatic dysfunction of lumbar region: Secondary | ICD-10-CM | POA: Diagnosis not present

## 2021-01-26 NOTE — Progress Notes (Signed)
Spoke w/ via phone for pre-op interview---pt Lab needs dos----   None COVID test ------01-27-2021 1000 Arrive at -------800 am 01-30-2021 NPO after MN NO Solid Food.  Clear liquids from MN until---700 am then npo Med rec completed Medications to take morning of surgery -----levothyroxine, sertraline, loratadine, omeprazole Diabetic medication -----n/a Patient instructed to bring photo id and insurance card day of surgery Patient aware to have Driver (ride ) / caregiver  Spouse cecil will stay Patient verbalized understanding of instructions that were given at this phone interview. Patient denies shortness of breath, chest pain, fever, cough at this phone interview.

## 2021-01-27 ENCOUNTER — Other Ambulatory Visit (HOSPITAL_COMMUNITY)
Admission: RE | Admit: 2021-01-27 | Discharge: 2021-01-27 | Disposition: A | Discharge status: Home / Self Care | Payer: Medicare Other | Source: Ambulatory Visit | Attending: General Surgery | Admitting: General Surgery

## 2021-01-27 DIAGNOSIS — Z20822 Contact with and (suspected) exposure to covid-19: Secondary | ICD-10-CM | POA: Diagnosis not present

## 2021-01-27 DIAGNOSIS — Z01812 Encounter for preprocedural laboratory examination: Secondary | ICD-10-CM | POA: Diagnosis not present

## 2021-01-27 LAB — SARS CORONAVIRUS 2 (TAT 6-24 HRS): SARS Coronavirus 2: NEGATIVE

## 2021-01-30 ENCOUNTER — Other Ambulatory Visit: Payer: Self-pay

## 2021-01-30 ENCOUNTER — Encounter (HOSPITAL_BASED_OUTPATIENT_CLINIC_OR_DEPARTMENT_OTHER): Payer: Self-pay | Admitting: General Surgery

## 2021-01-30 ENCOUNTER — Ambulatory Visit (HOSPITAL_BASED_OUTPATIENT_CLINIC_OR_DEPARTMENT_OTHER)
Admission: RE | Admit: 2021-01-30 | Discharge: 2021-01-30 | Disposition: A | Discharge status: Home / Self Care | Payer: Medicare Other | Attending: General Surgery | Admitting: General Surgery

## 2021-01-30 ENCOUNTER — Ambulatory Visit (HOSPITAL_BASED_OUTPATIENT_CLINIC_OR_DEPARTMENT_OTHER): Payer: Medicare Other | Admitting: Anesthesiology

## 2021-01-30 ENCOUNTER — Encounter (HOSPITAL_BASED_OUTPATIENT_CLINIC_OR_DEPARTMENT_OTHER)
Admission: RE | Disposition: A | Discharge status: Home / Self Care | Payer: Self-pay | Source: Home / Self Care | Attending: General Surgery

## 2021-01-30 DIAGNOSIS — K219 Gastro-esophageal reflux disease without esophagitis: Secondary | ICD-10-CM | POA: Insufficient documentation

## 2021-01-30 DIAGNOSIS — Z91048 Other nonmedicinal substance allergy status: Secondary | ICD-10-CM | POA: Insufficient documentation

## 2021-01-30 DIAGNOSIS — E039 Hypothyroidism, unspecified: Secondary | ICD-10-CM | POA: Diagnosis not present

## 2021-01-30 DIAGNOSIS — K8010 Calculus of gallbladder with chronic cholecystitis without obstruction: Secondary | ICD-10-CM | POA: Diagnosis not present

## 2021-01-30 DIAGNOSIS — Z91018 Allergy to other foods: Secondary | ICD-10-CM | POA: Insufficient documentation

## 2021-01-30 DIAGNOSIS — F419 Anxiety disorder, unspecified: Secondary | ICD-10-CM | POA: Diagnosis not present

## 2021-01-30 DIAGNOSIS — Z803 Family history of malignant neoplasm of breast: Secondary | ICD-10-CM | POA: Diagnosis not present

## 2021-01-30 HISTORY — PX: CHOLECYSTECTOMY: SHX55

## 2021-01-30 HISTORY — DX: Malignant (primary) neoplasm, unspecified: C80.1

## 2021-01-30 HISTORY — DX: Gastro-esophageal reflux disease without esophagitis: K21.9

## 2021-01-30 HISTORY — DX: Anxiety disorder, unspecified: F41.9

## 2021-01-30 HISTORY — DX: Hypothyroidism, unspecified: E03.9

## 2021-01-30 HISTORY — DX: Unspecified osteoarthritis, unspecified site: M19.90

## 2021-01-30 HISTORY — DX: Calculus of gallbladder with chronic cholecystitis without obstruction: K80.10

## 2021-01-30 HISTORY — DX: Nontoxic goiter, unspecified: E04.9

## 2021-01-30 HISTORY — DX: Age-related osteoporosis without current pathological fracture: M81.0

## 2021-01-30 HISTORY — DX: Presence of spectacles and contact lenses: Z97.3

## 2021-01-30 SURGERY — LAPAROSCOPIC CHOLECYSTECTOMY
Anesthesia: General | Site: Abdomen

## 2021-01-30 MED ORDER — CEFAZOLIN SODIUM-DEXTROSE 2-4 GM/100ML-% IV SOLN
2.0000 g | INTRAVENOUS | Status: AC
  Administered 2021-01-30: 2 g via INTRAVENOUS

## 2021-01-30 MED ORDER — ROCURONIUM BROMIDE 100 MG/10ML IV SOLN
INTRAVENOUS | Status: DC | PRN
  Administered 2021-01-30: 70 mg via INTRAVENOUS

## 2021-01-30 MED ORDER — PROPOFOL 10 MG/ML IV BOLUS
INTRAVENOUS | Status: DC | PRN
  Administered 2021-01-30: 50 mg via INTRAVENOUS
  Administered 2021-01-30: 130 mg via INTRAVENOUS
  Administered 2021-01-30: 70 mg via INTRAVENOUS

## 2021-01-30 MED ORDER — LIDOCAINE HCL (CARDIAC) PF 100 MG/5ML IV SOSY
PREFILLED_SYRINGE | INTRAVENOUS | Status: DC | PRN
  Administered 2021-01-30: 60 mg via INTRAVENOUS

## 2021-01-30 MED ORDER — HYDROMORPHONE HCL 1 MG/ML IJ SOLN
INTRAMUSCULAR | Status: AC
  Filled 2021-01-30: qty 1

## 2021-01-30 MED ORDER — DEXAMETHASONE SODIUM PHOSPHATE 4 MG/ML IJ SOLN
INTRAMUSCULAR | Status: DC | PRN
  Administered 2021-01-30: 5 mg via INTRAVENOUS

## 2021-01-30 MED ORDER — ONDANSETRON HCL 4 MG/2ML IJ SOLN
INTRAMUSCULAR | Status: DC | PRN
  Administered 2021-01-30: 4 mg via INTRAVENOUS

## 2021-01-30 MED ORDER — PROMETHAZINE HCL 25 MG/ML IJ SOLN
6.2500 mg | INTRAMUSCULAR | Status: DC | PRN
  Administered 2021-01-30: 6.25 mg via INTRAVENOUS

## 2021-01-30 MED ORDER — PHENYLEPHRINE HCL (PRESSORS) 10 MG/ML IV SOLN
INTRAVENOUS | Status: DC | PRN
  Administered 2021-01-30: 80 ug via INTRAVENOUS
  Administered 2021-01-30: 40 ug via INTRAVENOUS
  Administered 2021-01-30 (×3): 80 ug via INTRAVENOUS

## 2021-01-30 MED ORDER — ACETAMINOPHEN 500 MG PO TABS
ORAL_TABLET | ORAL | Status: AC
  Filled 2021-01-30: qty 2

## 2021-01-30 MED ORDER — EPHEDRINE 5 MG/ML INJ
INTRAVENOUS | Status: AC
  Filled 2021-01-30: qty 10

## 2021-01-30 MED ORDER — OXYCODONE HCL 5 MG/5ML PO SOLN: 5.0000 mg | Freq: Once | ORAL | Status: DC | PRN

## 2021-01-30 MED ORDER — MIDAZOLAM HCL 2 MG/2ML IJ SOLN
INTRAMUSCULAR | Status: AC
  Filled 2021-01-30: qty 2

## 2021-01-30 MED ORDER — KETOROLAC TROMETHAMINE 30 MG/ML IJ SOLN
INTRAMUSCULAR | Status: DC | PRN
  Administered 2021-01-30: 15 mg via INTRAVENOUS

## 2021-01-30 MED ORDER — ROCURONIUM BROMIDE 10 MG/ML (PF) SYRINGE
PREFILLED_SYRINGE | INTRAVENOUS | Status: AC
  Filled 2021-01-30: qty 10

## 2021-01-30 MED ORDER — OXYCODONE HCL 5 MG PO TABS: 5.0000 mg | ORAL_TABLET | Freq: Four times a day (QID) | ORAL | 0 refills | Status: AC | PRN

## 2021-01-30 MED ORDER — ONDANSETRON HCL 4 MG/2ML IJ SOLN
INTRAMUSCULAR | Status: AC
  Filled 2021-01-30: qty 2

## 2021-01-30 MED ORDER — ACETAMINOPHEN 500 MG PO TABS
1000.0000 mg | ORAL_TABLET | Freq: Once | ORAL | Status: AC
  Administered 2021-01-30: 1000 mg via ORAL

## 2021-01-30 MED ORDER — FENTANYL CITRATE (PF) 100 MCG/2ML IJ SOLN
INTRAMUSCULAR | Status: AC
  Filled 2021-01-30: qty 2

## 2021-01-30 MED ORDER — ACETAMINOPHEN 500 MG PO TABS: 1000.0000 mg | ORAL_TABLET | ORAL | Status: DC

## 2021-01-30 MED ORDER — HYDROMORPHONE HCL 1 MG/ML IJ SOLN
0.2500 mg | INTRAMUSCULAR | Status: DC | PRN
  Administered 2021-01-30: 0.5 mg via INTRAVENOUS

## 2021-01-30 MED ORDER — SCOPOLAMINE 1 MG/3DAYS TD PT72
MEDICATED_PATCH | TRANSDERMAL | Status: AC
  Filled 2021-01-30: qty 1

## 2021-01-30 MED ORDER — CELECOXIB 200 MG PO CAPS
400.0000 mg | ORAL_CAPSULE | ORAL | Status: AC
  Administered 2021-01-30: 400 mg via ORAL

## 2021-01-30 MED ORDER — EPHEDRINE SULFATE 50 MG/ML IJ SOLN
INTRAMUSCULAR | Status: DC | PRN
  Administered 2021-01-30: 10 mg via INTRAVENOUS

## 2021-01-30 MED ORDER — PHENYLEPHRINE 40 MCG/ML (10ML) SYRINGE FOR IV PUSH (FOR BLOOD PRESSURE SUPPORT)
PREFILLED_SYRINGE | INTRAVENOUS | Status: AC
  Filled 2021-01-30: qty 10

## 2021-01-30 MED ORDER — DEXAMETHASONE SODIUM PHOSPHATE 10 MG/ML IJ SOLN
INTRAMUSCULAR | Status: AC
  Filled 2021-01-30: qty 1

## 2021-01-30 MED ORDER — 0.9 % SODIUM CHLORIDE (POUR BTL) OPTIME
TOPICAL | Status: DC | PRN
  Administered 2021-01-30: 500 mL

## 2021-01-30 MED ORDER — LIDOCAINE 2% (20 MG/ML) 5 ML SYRINGE
INTRAMUSCULAR | Status: AC
  Filled 2021-01-30: qty 5

## 2021-01-30 MED ORDER — BUPIVACAINE HCL 0.5 % IJ SOLN
INTRAMUSCULAR | Status: DC | PRN
  Administered 2021-01-30: 30 mL

## 2021-01-30 MED ORDER — PROMETHAZINE HCL 25 MG/ML IJ SOLN
INTRAMUSCULAR | Status: AC
  Filled 2021-01-30: qty 1

## 2021-01-30 MED ORDER — SCOPOLAMINE 1 MG/3DAYS TD PT72
1.0000 | MEDICATED_PATCH | TRANSDERMAL | Status: DC
  Administered 2021-01-30: 1.5 mg via TRANSDERMAL

## 2021-01-30 MED ORDER — FENTANYL CITRATE (PF) 100 MCG/2ML IJ SOLN
INTRAMUSCULAR | Status: DC | PRN
  Administered 2021-01-30 (×4): 50 ug via INTRAVENOUS

## 2021-01-30 MED ORDER — GLYCOPYRROLATE 0.2 MG/ML IJ SOLN
INTRAMUSCULAR | Status: DC | PRN
  Administered 2021-01-30: .2 mg via INTRAVENOUS

## 2021-01-30 MED ORDER — SODIUM CHLORIDE 0.9 % IR SOLN
Status: DC | PRN
  Administered 2021-01-30: 900 mL

## 2021-01-30 MED ORDER — CELECOXIB 200 MG PO CAPS
ORAL_CAPSULE | ORAL | Status: AC
  Filled 2021-01-30: qty 2

## 2021-01-30 MED ORDER — GLYCOPYRROLATE PF 0.2 MG/ML IJ SOSY
PREFILLED_SYRINGE | INTRAMUSCULAR | Status: AC
  Filled 2021-01-30: qty 1

## 2021-01-30 MED ORDER — PROPOFOL 10 MG/ML IV BOLUS
INTRAVENOUS | Status: AC
  Filled 2021-01-30: qty 20

## 2021-01-30 MED ORDER — LACTATED RINGERS IV SOLN: INTRAVENOUS | Status: DC

## 2021-01-30 MED ORDER — KETOROLAC TROMETHAMINE 30 MG/ML IJ SOLN
INTRAMUSCULAR | Status: AC
  Filled 2021-01-30: qty 1

## 2021-01-30 MED ORDER — CEFAZOLIN SODIUM-DEXTROSE 2-4 GM/100ML-% IV SOLN
INTRAVENOUS | Status: AC
  Filled 2021-01-30: qty 100

## 2021-01-30 MED ORDER — CHLORHEXIDINE GLUCONATE CLOTH 2 % EX PADS: 6.0000 | MEDICATED_PAD | Freq: Once | CUTANEOUS | Status: DC

## 2021-01-30 MED ORDER — OXYCODONE HCL 5 MG PO TABS: 5.0000 mg | ORAL_TABLET | Freq: Once | ORAL | Status: DC | PRN

## 2021-01-30 MED ORDER — MIDAZOLAM HCL 5 MG/5ML IJ SOLN
INTRAMUSCULAR | Status: DC | PRN
  Administered 2021-01-30: 2 mg via INTRAVENOUS

## 2021-01-30 MED ORDER — ENSURE PRE-SURGERY PO LIQD: 296.0000 mL | Freq: Once | ORAL | Status: DC

## 2021-01-30 MED ORDER — IBUPROFEN 800 MG PO TABS: 800.0000 mg | ORAL_TABLET | Freq: Three times a day (TID) | ORAL | 0 refills | Status: AC | PRN

## 2021-01-30 MED ORDER — SUGAMMADEX SODIUM 200 MG/2ML IV SOLN
INTRAVENOUS | Status: DC | PRN
  Administered 2021-01-30: 200 mg via INTRAVENOUS

## 2021-01-30 SURGICAL SUPPLY — 56 items
ADH SKN CLS APL DERMABOND .7 (GAUZE/BANDAGES/DRESSINGS) ×1
APL PRP STRL LF DISP 70% ISPRP (MISCELLANEOUS) ×1
APL SKNCLS STERI-STRIP NONHPOA (GAUZE/BANDAGES/DRESSINGS)
APPLIER CLIP ROT 10 11.4 M/L (STAPLE)
APR CLP MED LRG 11.4X10 (STAPLE)
BAG SPEC RTRVL 10 TROC 200 (ENDOMECHANICALS) ×1
BENZOIN TINCTURE PRP APPL 2/3 (GAUZE/BANDAGES/DRESSINGS) IMPLANT
BNDG ADH 1X3 SHEER STRL LF (GAUZE/BANDAGES/DRESSINGS) ×8 IMPLANT
BNDG ADH THN 3X1 STRL LF (GAUZE/BANDAGES/DRESSINGS) ×4
CABLE HIGH FREQUENCY MONO STRZ (ELECTRODE) ×2 IMPLANT
CATH CHOLANG 76X19 KUMAR (CATHETERS) IMPLANT
CHLORAPREP W/TINT 26 (MISCELLANEOUS) ×2 IMPLANT
CLIP APPLIE ROT 10 11.4 M/L (STAPLE) IMPLANT
CLIP VESOLOCK LG 6/CT PURPLE (CLIP) IMPLANT
CLIP VESOLOCK MED LG 6/CT (CLIP) ×3 IMPLANT
COVER MAYO STAND STRL (DRAPES) ×2 IMPLANT
COVER SURGICAL LIGHT HANDLE (MISCELLANEOUS) ×1 IMPLANT
COVER WAND RF STERILE (DRAPES) ×2 IMPLANT
DECANTER SPIKE VIAL GLASS SM (MISCELLANEOUS) ×2 IMPLANT
DERMABOND ADVANCED (GAUZE/BANDAGES/DRESSINGS) ×1
DERMABOND ADVANCED .7 DNX12 (GAUZE/BANDAGES/DRESSINGS) ×1 IMPLANT
DRAIN CHANNEL 19F RND (DRAIN) IMPLANT
DRAPE C-ARM 42X120 X-RAY (DRAPES) ×1 IMPLANT
ELECT REM PT RETURN 9FT ADLT (ELECTROSURGICAL) ×2
ELECTRODE REM PT RTRN 9FT ADLT (ELECTROSURGICAL) ×1 IMPLANT
EVACUATOR SILICONE 100CC (DRAIN) IMPLANT
GLOVE SURG POLYISO LF SZ7 (GLOVE) ×2 IMPLANT
GLOVE SURG UNDER POLY LF SZ7 (GLOVE) ×2 IMPLANT
GOWN STRL REUS W/TWL LRG LVL3 (GOWN DISPOSABLE) ×2 IMPLANT
GRASPER SUT TROCAR 14GX15 (MISCELLANEOUS) IMPLANT
HEMOSTAT SNOW SURGICEL 2X4 (HEMOSTASIS) IMPLANT
IRRIG SUCT STRYKERFLOW 2 WTIP (MISCELLANEOUS) ×2
IRRIGATION SUCT STRKRFLW 2 WTP (MISCELLANEOUS) ×1 IMPLANT
IV CATH 14GX2 1/4 (CATHETERS) IMPLANT
IV NS 1000ML (IV SOLUTION) ×2
IV NS 1000ML BAXH (IV SOLUTION) IMPLANT
KIT TURNOVER CYSTO (KITS) ×2 IMPLANT
NS IRRIG 500ML POUR BTL (IV SOLUTION) ×2 IMPLANT
PACK BASIN DAY SURGERY FS (CUSTOM PROCEDURE TRAY) ×2 IMPLANT
PENCIL SMOKE EVACUATOR (MISCELLANEOUS) IMPLANT
POUCH RETRIEVAL ECOSAC 10 (ENDOMECHANICALS) ×1 IMPLANT
POUCH RETRIEVAL ECOSAC 10MM (ENDOMECHANICALS) ×2
SCISSORS LAP 5X35 DISP (ENDOMECHANICALS) ×2 IMPLANT
SET TUBE SMOKE EVAC HIGH FLOW (TUBING) ×2 IMPLANT
STOPCOCK 4 WAY LG BORE MALE ST (IV SETS) IMPLANT
STRIP CLOSURE SKIN 1/2X4 (GAUZE/BANDAGES/DRESSINGS) IMPLANT
SUT ETHILON 2 0 PS N (SUTURE) IMPLANT
SUT MNCRL AB 4-0 PS2 18 (SUTURE) ×2 IMPLANT
SUT VICRYL 0 ENDOLOOP (SUTURE) IMPLANT
SUT VICRYL 0 UR6 27IN ABS (SUTURE) IMPLANT
TOWEL OR 17X26 10 PK STRL BLUE (TOWEL DISPOSABLE) ×2 IMPLANT
TRAY LAPAROSCOPIC (CUSTOM PROCEDURE TRAY) ×2 IMPLANT
TROCAR BLADELESS OPT 12M 100M (ENDOMECHANICALS) IMPLANT
TROCAR BLADELESS OPT 5 100 (ENDOMECHANICALS) ×6 IMPLANT
TROCAR XCEL NON-BLD 11X100MML (ENDOMECHANICALS) ×2 IMPLANT
WARMER LAPAROSCOPE (MISCELLANEOUS) ×2 IMPLANT

## 2021-01-30 NOTE — Op Note (Signed)
PATIENT:  Madeline Horton  65 y.o. female  PRE-OPERATIVE DIAGNOSIS:  CHRONIC CALCULUS CHOLECYSTITIS  POST-OPERATIVE DIAGNOSIS:  CHRONIC CALCULUS CHOLECYSTITIS  PROCEDURE:  Procedure(s): LAPAROSCOPIC CHOLECYSTECTOMY  SURGEON:  Surgeon(s): Kinsinger, Arta Bruce, MD  ASSISTANT: Bland Span., MD, PGY4  ANESTHESIA:   local and general  Indications for procedure: Madeline Horton is a 65 y.o. female with symptoms of Abdominal pain consistent with gallbladder disease, with cholelithiasis confirmed by ultrasound.  Description of procedure: The patient was brought into the operative suite, placed supine. Anesthesia was administered with endotracheal tube. Patient was strapped in place and foot board was secured. All pressure points were offloaded by foam padding. The patient was prepped and draped in the usual sterile fashion.  A periumbilical incision was made and optical entry was used to enter the abdomen. 2 x 5 mm trocars were placed on in the right lateral space on in the right subcostal space. A 77mm trocar was placed in the subxiphoid space. Marcaine was infused to the subxiphoid space and lateral upper right abdomen in the transversus abdominis plane. Next the patient was placed in reverse trendelenberg. The gallbladder appeared chronically inflamed, with adherent omentum along its body as well as thickening of the infundibulum and a very large, impacted stone at the infundibulum. Omentum was adhered to the gallbladder and was taken down with cautery/blunt dissection.  The gallbladder was retracted cephalad and lateral. The peritoneum was reflected off the infundibulum working lateral to medial. The cystic duct and cystic artery were identified and further dissection revealed a critical view. The cystic duct and cystic artery were doubly clipped and ligated.   The gallbladder was removed off the liver bed with cautery. The Gallbladder was placed in a specimen bag. The  gallbladder fossa was irrigated and hemostasis was applied with cautery. The gallbladder was removed via the 44mm trocar. No dilation was required for removal, therefore no fascial closure was performed. Pneumoperitoneum was removed, all trocar were removed. All incisions were closed with 4-0 monocryl subcuticular stitch. The patient woke from anesthesia and was brought to PACU in stable condition. All counts were correct  Findings:   - evidence of chronic cholecystitis  - cholelithiasis  - small normal-appearing bile spillage  - no stones spilled  - nodular liver contour, potentially compatible with cirrhotic morphology (see image)  Specimen: gallbladder  Blood loss: 5 ml  Local anesthesia: 20 ml Marcaine  Complications: None immediate  PLAN OF CARE: Discharge to home after PACU  PATIENT DISPOSITION:  PACU - hemodynamically stable.  Images:        Madeline Horton, M.D. General, Bariatric, & Minimally Invasive Surgery [facility]

## 2021-01-30 NOTE — H&P (Signed)
   Reason for Consult: gallstones  Madeline Horton is an 65 y.o. female.  HPI: 65 yo female with multiple month history of epigastric abdominal pain. It is related to food.  Past Medical History:  Diagnosis Date  . Anxiety   . Arthritis    oa all over  . Cancer ([place])    squamous cell areas removed from face left and right arm and shoulder  . Chronic calculous cholecystitis   . GERD (gastroesophageal reflux disease)   . Goiter    follow up by dr Theadore Nan at [place]  . Hypothyroidism   . Osteoporosis   . Wears glasses    for reading    Past Surgical History:  Procedure Laterality Date  . ABDOMINAL HYSTERECTOMY  1985   right ovary and appendix removed also removed later  . APPENDECTOMY  1985   with hysyerectomy  . BREAST EXCISIONAL BIOPSY Left    benign  . left knee arthroscopic surgery  2000  . right wrist plates and screws  84/5364    Family History  Problem Relation Age of Onset  . Breast cancer Sister 31    Social History:  reports that she has never smoked. She has never used smokeless tobacco. She reports previous alcohol use. She reports that she does not use drugs.  Allergies:  Allergies  Allergen Reactions  . Pineapple     Blisters on mouth  . Tape     White medical tape with adhesive causes blisters can use paper tape    Medications: I have reviewed the patient's current medications.  No results found for this or any previous visit (from the past 48 hour(s)).  No results found.  Review of Systems  Constitutional: Negative for chills and fever.  HENT: Negative for hearing loss.   Eyes: Negative for blurred vision and double vision.  Respiratory: Negative for cough and hemoptysis.   Cardiovascular: Negative for chest pain and palpitations.  Gastrointestinal: Positive for abdominal pain and nausea. Negative for vomiting.  Genitourinary: Negative for dysuria and urgency.  Musculoskeletal: Negative for myalgias and neck pain.  Skin:  Negative for itching and rash.  Neurological: Negative for dizziness, tingling and headaches.  Endo/Heme/Allergies: Does not bruise/bleed easily.  Psychiatric/Behavioral: Negative for depression and suicidal ideas.    PE Blood pressure (!) (P) 133/98, pulse (P) 95, temperature (P) 97.7 F (36.5 C), temperature source (P) Oral, resp. rate (P) 16, height 5\' 8"  (1.727 m), weight 81.2 kg, SpO2 (P) 100 %. Constitutional: NAD; conversant; no deformities Eyes: Moist conjunctiva; no lid lag; anicteric; PERRL Neck: Trachea midline; no thyromegaly Lungs: Normal respiratory effort; no tactile fremitus CV: RRR; no palpable thrills; no pitting edema GI: Abd tender in epigastrim; no palpable hepatosplenomegaly MSK: Normal gait; no clubbing/cyanosis Psychiatric: Appropriate affect; alert and oriented x3 Lymphatic: No palpable cervical or axillary lymphadenopathy Skin: No major subcutaneous nodules. Warm and dry   Assessment/Plan: 65 yo female with epigastric pain and gallstones on imaging consistent with chronic calculous cholecystitis -lap chole -planned outpatient procedure -ERAS protocol  Madeline Horton 01/30/2021, 9:28 AM

## 2021-01-30 NOTE — Anesthesia Postprocedure Evaluation (Signed)
Anesthesia Post Note  Patient: Madeline Horton  Procedure(s) Performed: LAPAROSCOPIC CHOLECYSTECTOMY (N/A Abdomen)     Patient location during evaluation: PACU Anesthesia Type: General Level of consciousness: awake and alert, oriented and patient cooperative Pain management: pain level controlled Vital Signs Assessment: post-procedure vital signs reviewed and stable Respiratory status: spontaneous breathing, nonlabored ventilation and respiratory function stable Cardiovascular status: blood pressure returned to baseline and stable Postop Assessment: no apparent nausea or vomiting Anesthetic complications: no   No complications documented.  Last Vitals:  Vitals:   01/30/21 1247 01/30/21 1249  BP: 125/82   Pulse: (!) 102 100  Resp: 10 (!) 8  Temp:    SpO2: 90% 90%    Last Pain:  Vitals:   01/30/21 1225  TempSrc:   PainSc: [place]

## 2021-01-30 NOTE — Anesthesia Preprocedure Evaluation (Addendum)
Anesthesia Evaluation  Patient identified by MRN, date of birth, ID band Patient awake    Reviewed: Allergy & Precautions, NPO status , Patient's Chart, lab work & pertinent test results  Airway Mallampati: III  TM Distance: >3 FB Neck ROM: Full    Dental no notable dental hx. (+) Teeth Intact, Dental Advisory Given   Pulmonary neg pulmonary ROS,    Pulmonary exam normal breath sounds clear to auscultation       Cardiovascular negative cardio ROS Normal cardiovascular exam Rhythm:Regular Rate:Normal     Neuro/Psych PSYCHIATRIC DISORDERS Anxiety negative neurological ROS     GI/Hepatic GERD  Medicated and Controlled,Chronic calculus cholecystitis    Endo/Other  Hypothyroidism   Renal/GU negative Renal ROS  negative genitourinary   Musculoskeletal  (+) Arthritis , Osteoarthritis,    Abdominal   Peds negative pediatric ROS (+)  Hematology negative hematology ROS (+)   Anesthesia Other Findings   Reproductive/Obstetrics negative OB ROS                            Anesthesia Physical Anesthesia Plan  ASA: II  Anesthesia Plan: General   Post-op Pain Management:    Induction: Intravenous  PONV Risk Score and Plan: 4 or greater and Ondansetron, Dexamethasone, Midazolam and Treatment may vary due to age or medical condition  Airway Management Planned: Oral ETT  Additional Equipment: None  Intra-op Plan:   Post-operative Plan: Extubation in OR  Informed Consent: I have reviewed the patients History and Physical, chart, labs and discussed the procedure including the risks, benefits and alternatives for the proposed anesthesia with the patient or authorized representative who has indicated his/her understanding and acceptance.     Dental advisory given  Plan Discussed with: CRNA  Anesthesia Plan Comments:         Anesthesia Quick Evaluation

## 2021-01-30 NOTE — Discharge Instructions (Signed)
[facility] ______[facility] LAPAROSCOPIC SURGERY: POST OP INSTRUCTIONS Always review your discharge instruction sheet given to you by the facility where your surgery was performed. IF YOU HAVE DISABILITY OR FAMILY LEAVE FORMS, YOU MUST BRING THEM TO THE OFFICE FOR PROCESSING.   DO NOT GIVE THEM TO YOUR DOCTOR.  1. A prescription for pain medication may be given to you upon discharge.  Take your pain medication as prescribed, if needed.  If narcotic pain medicine is not needed, then you may take acetaminophen (Tylenol) or ibuprofen (Advil) as needed. 2. Take your usually prescribed medications unless otherwise directed. 3. If you need a refill on your pain medication, please contact your pharmacy.  They will contact our office to request authorization. Prescriptions will not be filled after 5pm or on week-ends. 4. You should follow a light diet the first few days after arrival home, such as soup and crackers, etc.  Be sure to include lots of fluids daily. 5. Most patients will experience some swelling and bruising in the area of the incisions.  Ice packs will help.  Swelling and bruising can take several days to resolve.  6. It is common to experience some constipation if taking pain medication after surgery.  Increasing fluid intake and taking a stool softener (such as Colace) will usually help or prevent this problem from occurring.  A mild laxative (Milk of Magnesia or Miralax) should be taken according to package instructions if there are no bowel movements after 48 hours. 7. Unless discharge instructions indicate otherwise, you may remove your bandages 24-48 hours after surgery, and you may shower at that time.  You may have steri-strips (small skin tapes) in place directly over the incision.  These strips should be left on the skin for 7-10 days.  If your surgeon used skin glue on the incision, you may shower in 24 hours.  The glue will flake off over the next 2-3 weeks.  Any sutures or  staples will be removed at the office during your follow-up visit. 8. ACTIVITIES:  You may resume regular (light) daily activities beginning the next day--such as daily self-care, walking, climbing stairs--gradually increasing activities as tolerated.  You may have sexual intercourse when it is comfortable.  Refrain from any heavy lifting or straining until approved by your doctor. a. You may drive when you are no longer taking prescription pain medication, you can comfortably wear a seatbelt, and you can safely maneuver your car and apply brakes. b. RETURN TO WORK:  __________________________________________________________ 9. You should see your doctor in the office for a follow-up appointment approximately 2-3 weeks after your surgery.  Make sure that you call for this appointment within a day or two after you arrive home to insure a convenient appointment time. 10. OTHER INSTRUCTIONS: __________________________________________________________________________________________________________________________ __________________________________________________________________________________________________________________________ WHEN TO CALL YOUR DOCTOR: 1. Fever over 101.0 2. Inability to urinate 3. Continued bleeding from incision. 4. Increased pain, redness, or drainage from the incision. 5. Increasing abdominal pain  The clinic staff is available to answer your questions during regular business hours.  Please don't hesitate to call and ask to speak to one of the nurses for clinical concerns.  If you have a medical emergency, go to the nearest emergency room or call 911.  A surgeon from [facility] is always on call at the hospital. [address] [place], [place], [place]  [address] ? [address], [place], [place]   [address] [phone] ? [phone] ? FAX [phone] Web site:  [email or web address] ° ° °Post Anesthesia Home Care Instructions ° °Activity: °Get plenty of rest for the remainder of the day. A responsible individual must stay with you for 24 hours following the procedure.  °For the next 24 hours, DO NOT: °-Drive a car °-Operate machinery °-Drink alcoholic beverages °-Take any medication unless instructed by your physician °-Make any legal decisions or sign important papers. ° °Meals: °Start with liquid foods such as gelatin or soup. Progress to regular foods as tolerated. Avoid greasy, spicy, heavy foods. If nausea and/or vomiting occur, drink only clear liquids until the nausea and/or vomiting subsides. Call your physician if vomiting continues. ° °Special Instructions/Symptoms: °Your throat may feel dry or sore from the anesthesia or the breathing tube placed in your throat during surgery. If this causes discomfort, gargle with warm salt water. The discomfort should disappear within 24 hours. ° °If you had a scopolamine patch placed behind your ear for the management of post- operative nausea and/or vomiting: ° °1. The medication in the patch is effective for 72 hours, after which it should be removed.  Wrap patch in a tissue and discard in the trash. Wash hands thoroughly with soap and water. °2. You may remove the patch earlier than 72 hours if you experience unpleasant side effects which may include dry mouth, dizziness or visual disturbances. °3. Avoid touching the patch. Wash your hands with soap and water after contact with the patch. °   ° °

## 2021-01-30 NOTE — Transfer of Care (Signed)
Immediate Anesthesia Transfer of Care Note  Patient: Madeline Horton  Procedure(s) Performed: Procedure(s) (LRB): LAPAROSCOPIC CHOLECYSTECTOMY (N/A)  Patient Location: PACU  Anesthesia Type: General  Level of Consciousness: awake, sedated, patient cooperative and responds to stimulation  Airway & Oxygen Therapy: Patient Spontanous Breathing and Patient connected to [place] 02 and soft FM  Post-op Assessment: Report given to PACU RN, Post -op Vital signs reviewed and stable and Patient moving all extremities  Post vital signs: Reviewed and stable  Complications: No apparent anesthesia complications

## 2021-01-30 NOTE — Anesthesia Procedure Notes (Signed)
Procedure Name: Intubation Date/Time: 01/30/2021 10:01 AM Performed by: Justice Rocher, CRNA Pre-anesthesia Checklist: Patient identified, Emergency Drugs available, Suction available, Patient being monitored and Timeout performed Patient Re-evaluated:Patient Re-evaluated prior to induction Oxygen Delivery Method: Circle system utilized Preoxygenation: Pre-oxygenation with 100% oxygen Induction Type: IV induction Ventilation: Mask ventilation without difficulty Laryngoscope Size: Mac and 3 Grade View: Grade II Tube type: Oral Tube size: 7.0 mm Number of attempts: 1 Airway Equipment and Method: Stylet and Oral airway Placement Confirmation: ETT inserted through vocal cords under direct vision,  positive ETCO2,  breath sounds checked- equal and bilateral and CO2 detector Secured at: 23 cm Tube secured with: Tape Dental Injury: Teeth and Oropharynx as per pre-operative assessment

## 2021-02-02 ENCOUNTER — Encounter (HOSPITAL_BASED_OUTPATIENT_CLINIC_OR_DEPARTMENT_OTHER): Payer: Self-pay | Admitting: General Surgery

## 2021-02-02 LAB — SURGICAL PATHOLOGY

## 2021-02-09 DIAGNOSIS — M5137 Other intervertebral disc degeneration, lumbosacral region: Secondary | ICD-10-CM | POA: Diagnosis not present

## 2021-02-09 DIAGNOSIS — M9903 Segmental and somatic dysfunction of lumbar region: Secondary | ICD-10-CM | POA: Diagnosis not present

## 2021-02-23 DIAGNOSIS — M5137 Other intervertebral disc degeneration, lumbosacral region: Secondary | ICD-10-CM | POA: Diagnosis not present

## 2021-02-23 DIAGNOSIS — M9903 Segmental and somatic dysfunction of lumbar region: Secondary | ICD-10-CM | POA: Diagnosis not present

## 2021-02-27 DIAGNOSIS — M810 Age-related osteoporosis without current pathological fracture: Secondary | ICD-10-CM | POA: Diagnosis not present

## 2021-03-19 DIAGNOSIS — M9903 Segmental and somatic dysfunction of lumbar region: Secondary | ICD-10-CM | POA: Diagnosis not present

## 2021-03-19 DIAGNOSIS — M5137 Other intervertebral disc degeneration, lumbosacral region: Secondary | ICD-10-CM | POA: Diagnosis not present

## 2021-04-29 DIAGNOSIS — M9903 Segmental and somatic dysfunction of lumbar region: Secondary | ICD-10-CM | POA: Diagnosis not present

## 2021-04-29 DIAGNOSIS — M5137 Other intervertebral disc degeneration, lumbosacral region: Secondary | ICD-10-CM | POA: Diagnosis not present

## 2021-05-27 ENCOUNTER — Other Ambulatory Visit: Payer: Self-pay | Admitting: Family Medicine

## 2021-05-27 DIAGNOSIS — Z1231 Encounter for screening mammogram for malignant neoplasm of breast: Secondary | ICD-10-CM

## 2021-06-15 ENCOUNTER — Other Ambulatory Visit: Payer: Self-pay | Admitting: Family Medicine

## 2021-06-15 DIAGNOSIS — M810 Age-related osteoporosis without current pathological fracture: Secondary | ICD-10-CM

## 2021-07-01 DIAGNOSIS — Z23 Encounter for immunization: Secondary | ICD-10-CM | POA: Diagnosis not present

## 2021-07-06 ENCOUNTER — Other Ambulatory Visit: Payer: Self-pay | Admitting: Family Medicine

## 2021-07-06 ENCOUNTER — Other Ambulatory Visit: Payer: Self-pay

## 2021-07-06 ENCOUNTER — Ambulatory Visit
Admission: RE | Admit: 2021-07-06 | Discharge: 2021-07-06 | Disposition: A | Discharge status: Home / Self Care | Payer: Medicare Other | Source: Ambulatory Visit | Attending: Family Medicine | Admitting: Family Medicine

## 2021-07-06 DIAGNOSIS — Z1231 Encounter for screening mammogram for malignant neoplasm of breast: Secondary | ICD-10-CM

## 2021-07-06 DIAGNOSIS — N6310 Unspecified lump in the right breast, unspecified quadrant: Secondary | ICD-10-CM

## 2021-07-09 ENCOUNTER — Ambulatory Visit
Admission: RE | Admit: 2021-07-09 | Discharge: 2021-07-09 | Disposition: A | Discharge status: Home / Self Care | Payer: Medicare Other | Source: Ambulatory Visit | Attending: Family Medicine | Admitting: Family Medicine

## 2021-07-09 ENCOUNTER — Other Ambulatory Visit: Payer: Self-pay

## 2021-07-09 DIAGNOSIS — M8589 Other specified disorders of bone density and structure, multiple sites: Secondary | ICD-10-CM | POA: Diagnosis not present

## 2021-07-09 DIAGNOSIS — M810 Age-related osteoporosis without current pathological fracture: Secondary | ICD-10-CM

## 2021-07-15 DIAGNOSIS — L905 Scar conditions and fibrosis of skin: Secondary | ICD-10-CM | POA: Diagnosis not present

## 2021-07-15 DIAGNOSIS — D485 Neoplasm of uncertain behavior of skin: Secondary | ICD-10-CM | POA: Diagnosis not present

## 2021-07-15 DIAGNOSIS — L989 Disorder of the skin and subcutaneous tissue, unspecified: Secondary | ICD-10-CM | POA: Diagnosis not present

## 2021-07-15 DIAGNOSIS — M71349 Other bursal cyst, unspecified hand: Secondary | ICD-10-CM | POA: Diagnosis not present

## 2021-07-15 DIAGNOSIS — L570 Actinic keratosis: Secondary | ICD-10-CM | POA: Diagnosis not present

## 2021-07-15 DIAGNOSIS — L821 Other seborrheic keratosis: Secondary | ICD-10-CM | POA: Diagnosis not present

## 2021-07-15 DIAGNOSIS — Z85828 Personal history of other malignant neoplasm of skin: Secondary | ICD-10-CM | POA: Diagnosis not present

## 2021-07-15 DIAGNOSIS — L820 Inflamed seborrheic keratosis: Secondary | ICD-10-CM | POA: Diagnosis not present

## 2021-07-16 ENCOUNTER — Ambulatory Visit
Admission: RE | Admit: 2021-07-16 | Discharge: 2021-07-16 | Disposition: A | Discharge status: Home / Self Care | Payer: Medicare Other | Source: Ambulatory Visit | Attending: Family Medicine | Admitting: Family Medicine

## 2021-07-16 ENCOUNTER — Other Ambulatory Visit: Payer: Self-pay

## 2021-07-16 DIAGNOSIS — R922 Inconclusive mammogram: Secondary | ICD-10-CM | POA: Diagnosis not present

## 2021-07-16 DIAGNOSIS — N6310 Unspecified lump in the right breast, unspecified quadrant: Secondary | ICD-10-CM

## 2021-09-02 DIAGNOSIS — L986 Other infiltrative disorders of the skin and subcutaneous tissue: Secondary | ICD-10-CM | POA: Diagnosis not present

## 2021-09-02 DIAGNOSIS — L821 Other seborrheic keratosis: Secondary | ICD-10-CM | POA: Diagnosis not present

## 2021-09-02 DIAGNOSIS — L814 Other melanin hyperpigmentation: Secondary | ICD-10-CM | POA: Diagnosis not present

## 2021-09-02 DIAGNOSIS — L570 Actinic keratosis: Secondary | ICD-10-CM | POA: Diagnosis not present

## 2021-09-02 DIAGNOSIS — D485 Neoplasm of uncertain behavior of skin: Secondary | ICD-10-CM | POA: Diagnosis not present

## 2021-12-10 DIAGNOSIS — M810 Age-related osteoporosis without current pathological fracture: Secondary | ICD-10-CM | POA: Diagnosis not present

## 2021-12-10 DIAGNOSIS — E039 Hypothyroidism, unspecified: Secondary | ICD-10-CM | POA: Diagnosis not present

## 2021-12-10 DIAGNOSIS — E782 Mixed hyperlipidemia: Secondary | ICD-10-CM | POA: Diagnosis not present

## 2021-12-17 DIAGNOSIS — E039 Hypothyroidism, unspecified: Secondary | ICD-10-CM | POA: Diagnosis not present

## 2021-12-17 DIAGNOSIS — Z0000 Encounter for general adult medical examination without abnormal findings: Secondary | ICD-10-CM | POA: Diagnosis not present

## 2021-12-17 DIAGNOSIS — K219 Gastro-esophageal reflux disease without esophagitis: Secondary | ICD-10-CM | POA: Diagnosis not present

## 2021-12-17 DIAGNOSIS — G8929 Other chronic pain: Secondary | ICD-10-CM | POA: Diagnosis not present

## 2021-12-17 DIAGNOSIS — M810 Age-related osteoporosis without current pathological fracture: Secondary | ICD-10-CM | POA: Diagnosis not present

## 2021-12-17 DIAGNOSIS — M545 Low back pain, unspecified: Secondary | ICD-10-CM | POA: Diagnosis not present

## 2021-12-17 DIAGNOSIS — Z1331 Encounter for screening for depression: Secondary | ICD-10-CM | POA: Diagnosis not present

## 2021-12-17 DIAGNOSIS — Z1389 Encounter for screening for other disorder: Secondary | ICD-10-CM | POA: Diagnosis not present

## 2022-05-16 IMAGING — MG DIGITAL SCREENING BILAT W/ TOMO W/ CAD
8 series · 9 of 24 positions shown · non-contrast
Comparison: Previous exam(s).

CLINICAL DATA: Screening.

EXAM:
DIGITAL SCREENING BILATERAL MAMMOGRAM WITH TOMO AND CAD

[L MLO synth-2D]
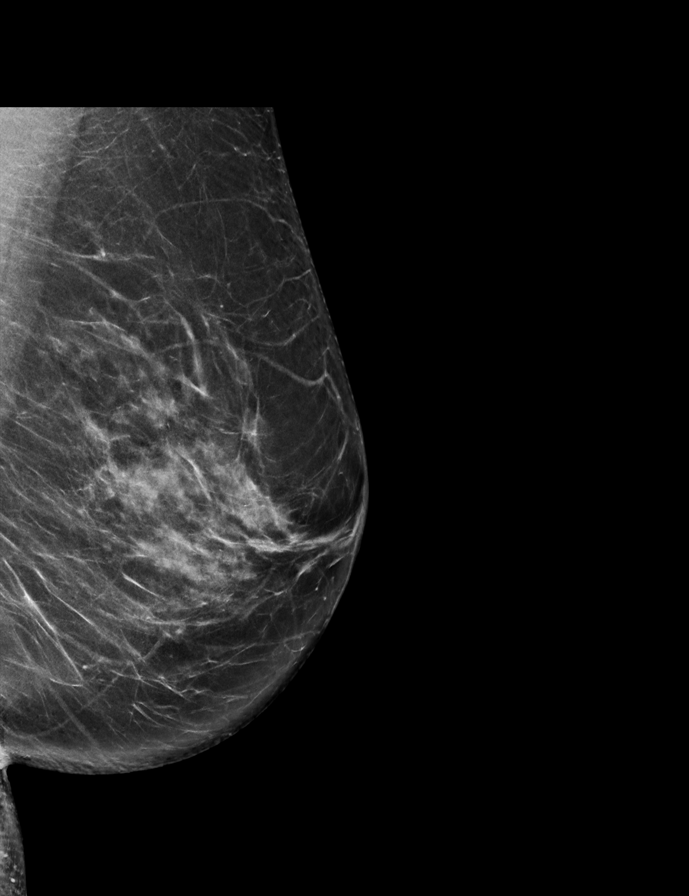

[R MLO synth-2D]
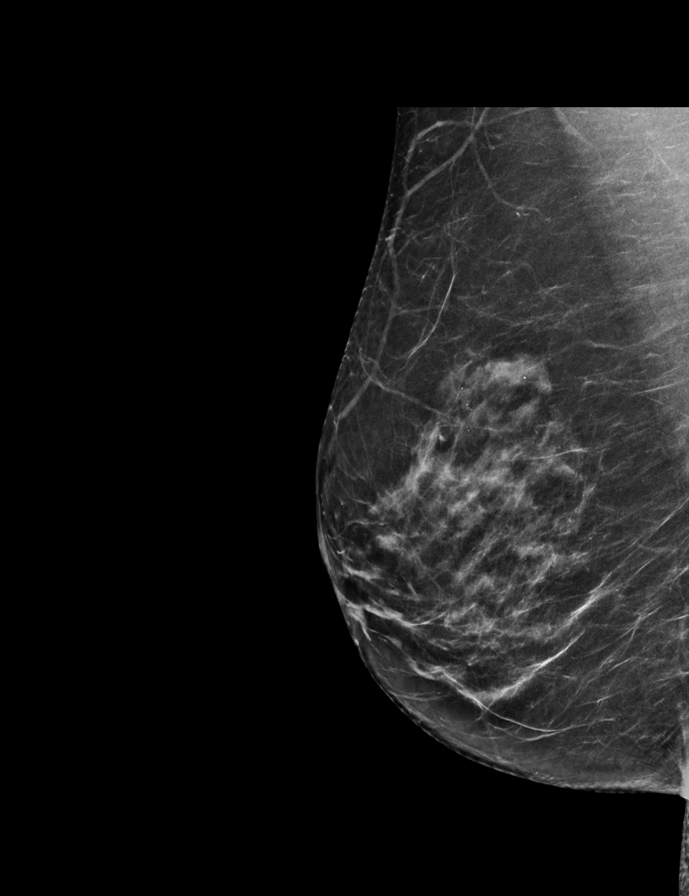

[R CC synth-2D]
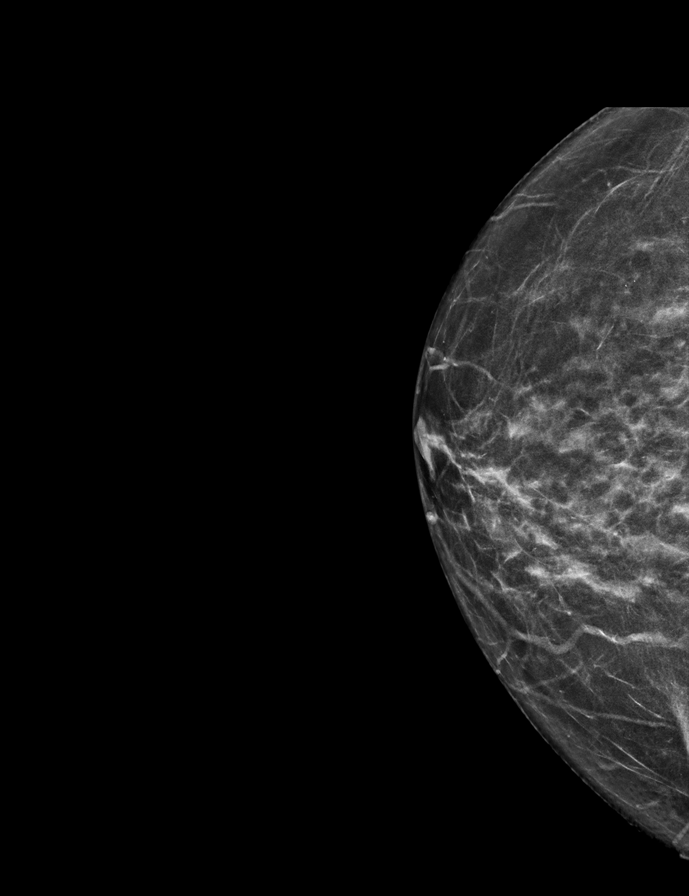

[L CC synth-2D]
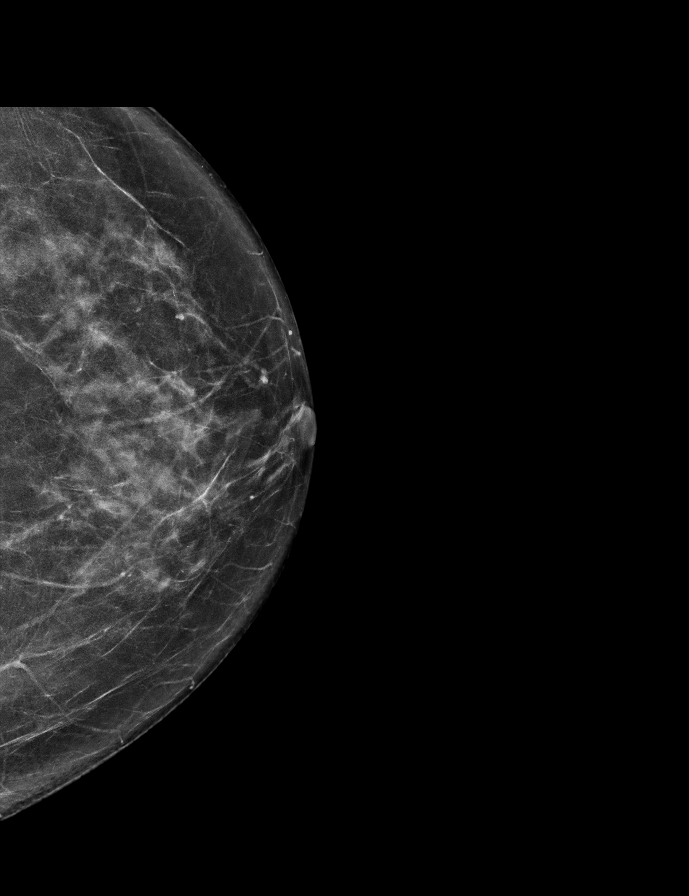

[R CC tomo · 2 of 53 frames shown]
[frame 18/53]
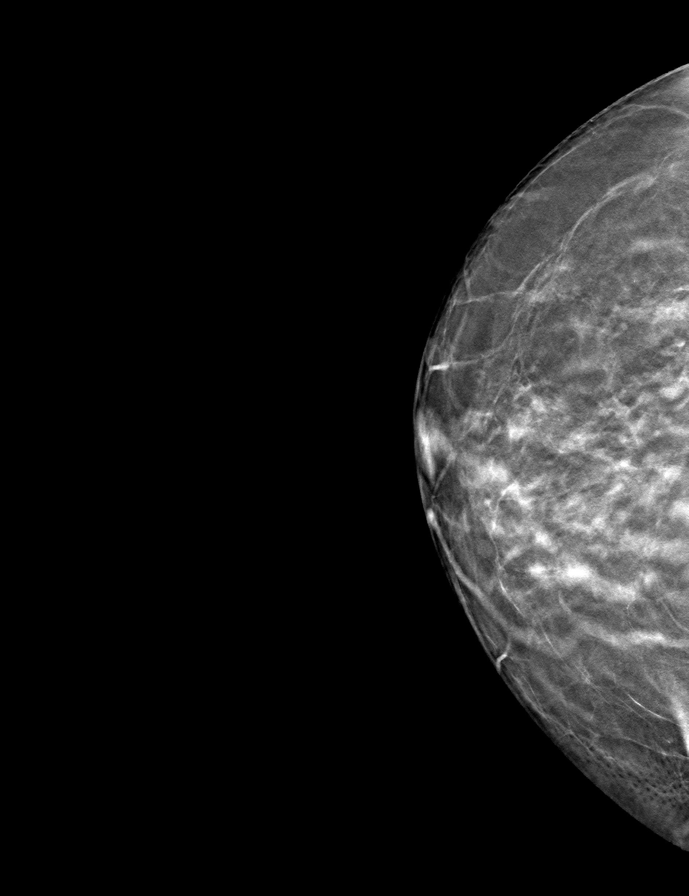
[frame 27/53]
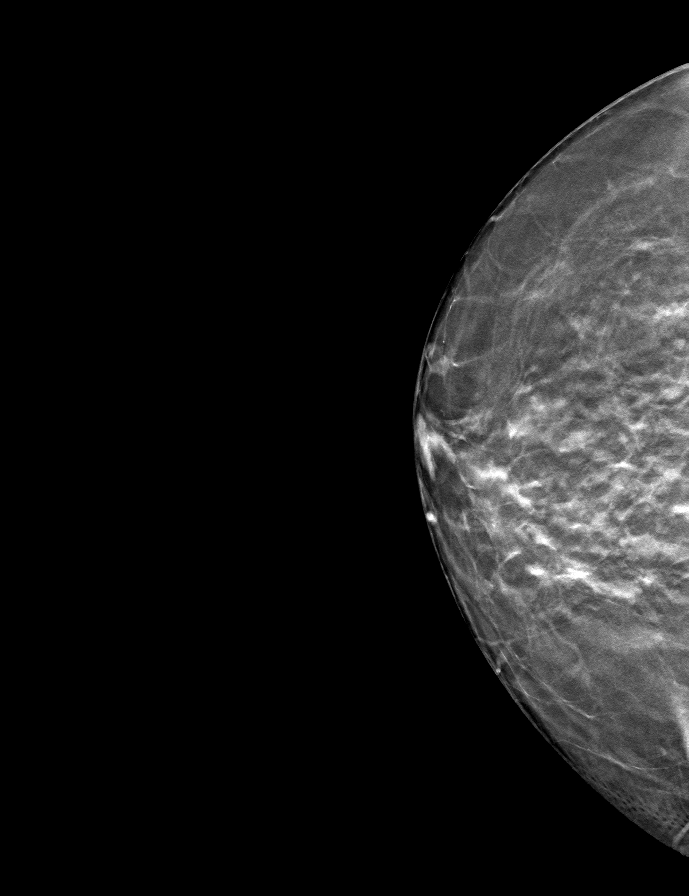

[R MLO tomo · tomo slice 33/64.0]
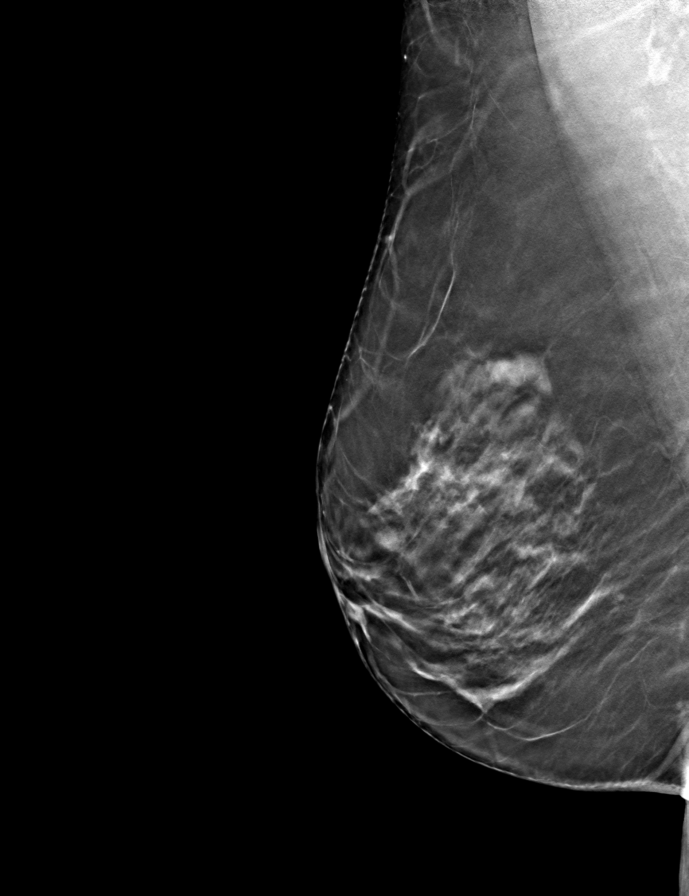

[L CC tomo · tomo slice 33/66.0]
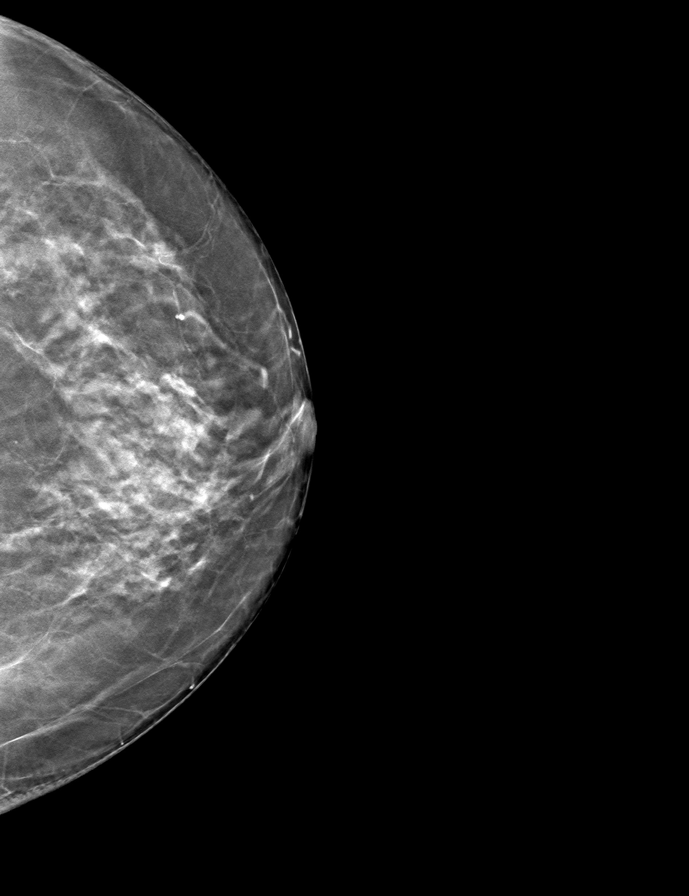

[L MLO tomo · tomo slice 37/72.0]
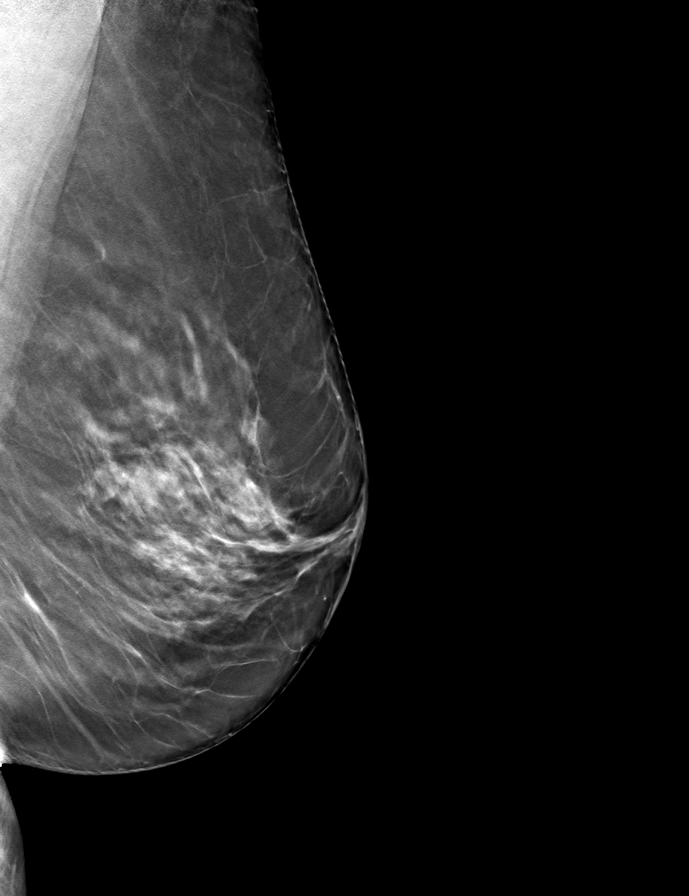

[9 of 24 positions shown; findings below may reference images not displayed]

ACR Breast Density Category c: The breast tissue is heterogeneously
dense, which may obscure small masses.
FINDINGS: There are no findings suspicious for malignancy. Images were
processed with CAD.
IMPRESSION: No mammographic evidence of malignancy. A result letter of this
screening mammogram will be mailed directly to the patient.

RECOMMENDATION:
Screening mammogram in one year. (Code:[id number])

BI-RADS CATEGORY  1: Negative.

## 2022-06-29 ENCOUNTER — Other Ambulatory Visit: Payer: Self-pay | Admitting: Family Medicine

## 2022-06-29 DIAGNOSIS — Z1231 Encounter for screening mammogram for malignant neoplasm of breast: Secondary | ICD-10-CM

## 2022-08-04 ENCOUNTER — Ambulatory Visit
Admission: RE | Admit: 2022-08-04 | Discharge: 2022-08-04 | Disposition: A | Discharge status: Home / Self Care | Payer: Medicare HMO | Source: Ambulatory Visit | Attending: Family Medicine | Admitting: Family Medicine

## 2022-08-04 DIAGNOSIS — Z1231 Encounter for screening mammogram for malignant neoplasm of breast: Secondary | ICD-10-CM | POA: Diagnosis not present

## 2022-08-11 DIAGNOSIS — L821 Other seborrheic keratosis: Secondary | ICD-10-CM | POA: Diagnosis not present

## 2022-08-11 DIAGNOSIS — Z85828 Personal history of other malignant neoplasm of skin: Secondary | ICD-10-CM | POA: Diagnosis not present

## 2022-08-11 DIAGNOSIS — L910 Hypertrophic scar: Secondary | ICD-10-CM | POA: Diagnosis not present

## 2022-08-11 DIAGNOSIS — D485 Neoplasm of uncertain behavior of skin: Secondary | ICD-10-CM | POA: Diagnosis not present

## 2022-09-06 DIAGNOSIS — L988 Other specified disorders of the skin and subcutaneous tissue: Secondary | ICD-10-CM | POA: Diagnosis not present

## 2022-09-06 DIAGNOSIS — L814 Other melanin hyperpigmentation: Secondary | ICD-10-CM | POA: Diagnosis not present

## 2022-09-06 DIAGNOSIS — L570 Actinic keratosis: Secondary | ICD-10-CM | POA: Diagnosis not present

## 2022-09-06 DIAGNOSIS — D485 Neoplasm of uncertain behavior of skin: Secondary | ICD-10-CM | POA: Diagnosis not present

## 2022-09-06 DIAGNOSIS — C44329 Squamous cell carcinoma of skin of other parts of face: Secondary | ICD-10-CM | POA: Diagnosis not present

## 2022-09-06 DIAGNOSIS — L439 Lichen planus, unspecified: Secondary | ICD-10-CM | POA: Diagnosis not present

## 2022-09-06 DIAGNOSIS — D0439 Carcinoma in situ of skin of other parts of face: Secondary | ICD-10-CM | POA: Diagnosis not present

## 2022-11-25 DIAGNOSIS — C44329 Squamous cell carcinoma of skin of other parts of face: Secondary | ICD-10-CM | POA: Diagnosis not present

## 2022-12-22 DIAGNOSIS — M79641 Pain in right hand: Secondary | ICD-10-CM | POA: Diagnosis not present

## 2022-12-22 DIAGNOSIS — W548XXA Other contact with dog, initial encounter: Secondary | ICD-10-CM | POA: Diagnosis not present

## 2022-12-22 DIAGNOSIS — S63511A Sprain of carpal joint of right wrist, initial encounter: Secondary | ICD-10-CM | POA: Diagnosis not present

## 2022-12-22 DIAGNOSIS — Z885 Allergy status to narcotic agent status: Secondary | ICD-10-CM | POA: Diagnosis not present

## 2022-12-22 DIAGNOSIS — W010XXA Fall on same level from slipping, tripping and stumbling without subsequent striking against object, initial encounter: Secondary | ICD-10-CM | POA: Diagnosis not present

## 2022-12-22 DIAGNOSIS — S63501A Unspecified sprain of right wrist, initial encounter: Secondary | ICD-10-CM | POA: Diagnosis not present

## 2022-12-22 DIAGNOSIS — D0439 Carcinoma in situ of skin of other parts of face: Secondary | ICD-10-CM | POA: Diagnosis not present

## 2022-12-22 DIAGNOSIS — M25531 Pain in right wrist: Secondary | ICD-10-CM | POA: Diagnosis not present

## 2022-12-22 DIAGNOSIS — Z881 Allergy status to other antibiotic agents status: Secondary | ICD-10-CM | POA: Diagnosis not present

## 2022-12-22 DIAGNOSIS — S6991XA Unspecified injury of right wrist, hand and finger(s), initial encounter: Secondary | ICD-10-CM | POA: Diagnosis not present

## 2022-12-23 DIAGNOSIS — Z01419 Encounter for gynecological examination (general) (routine) without abnormal findings: Secondary | ICD-10-CM | POA: Diagnosis not present

## 2022-12-23 DIAGNOSIS — Z1331 Encounter for screening for depression: Secondary | ICD-10-CM | POA: Diagnosis not present

## 2022-12-23 DIAGNOSIS — Z9181 History of falling: Secondary | ICD-10-CM | POA: Diagnosis not present

## 2022-12-23 DIAGNOSIS — M810 Age-related osteoporosis without current pathological fracture: Secondary | ICD-10-CM | POA: Diagnosis not present

## 2022-12-23 DIAGNOSIS — M545 Low back pain, unspecified: Secondary | ICD-10-CM | POA: Diagnosis not present

## 2022-12-23 DIAGNOSIS — K219 Gastro-esophageal reflux disease without esophagitis: Secondary | ICD-10-CM | POA: Diagnosis not present

## 2022-12-23 DIAGNOSIS — Z0000 Encounter for general adult medical examination without abnormal findings: Secondary | ICD-10-CM | POA: Diagnosis not present

## 2022-12-23 DIAGNOSIS — E039 Hypothyroidism, unspecified: Secondary | ICD-10-CM | POA: Diagnosis not present

## 2023-02-22 DIAGNOSIS — Z08 Encounter for follow-up examination after completed treatment for malignant neoplasm: Secondary | ICD-10-CM | POA: Diagnosis not present

## 2023-02-22 DIAGNOSIS — D0439 Carcinoma in situ of skin of other parts of face: Secondary | ICD-10-CM | POA: Diagnosis not present

## 2023-07-06 ENCOUNTER — Other Ambulatory Visit: Payer: Self-pay | Admitting: Family Medicine

## 2023-07-06 DIAGNOSIS — Z1231 Encounter for screening mammogram for malignant neoplasm of breast: Secondary | ICD-10-CM

## 2023-08-15 ENCOUNTER — Ambulatory Visit
Admission: RE | Admit: 2023-08-15 | Discharge: 2023-08-15 | Disposition: A | Discharge status: Home / Self Care | Payer: Medicare HMO | Source: Ambulatory Visit | Attending: Family Medicine | Admitting: Family Medicine

## 2023-08-15 DIAGNOSIS — Z1231 Encounter for screening mammogram for malignant neoplasm of breast: Secondary | ICD-10-CM | POA: Diagnosis not present

## 2023-08-16 DIAGNOSIS — L821 Other seborrheic keratosis: Secondary | ICD-10-CM | POA: Diagnosis not present

## 2023-08-16 DIAGNOSIS — L814 Other melanin hyperpigmentation: Secondary | ICD-10-CM | POA: Diagnosis not present

## 2023-08-16 DIAGNOSIS — Z85828 Personal history of other malignant neoplasm of skin: Secondary | ICD-10-CM | POA: Diagnosis not present

## 2023-08-16 DIAGNOSIS — D2271 Melanocytic nevi of right lower limb, including hip: Secondary | ICD-10-CM | POA: Diagnosis not present

## 2023-08-16 DIAGNOSIS — L905 Scar conditions and fibrosis of skin: Secondary | ICD-10-CM | POA: Diagnosis not present

## 2023-08-16 DIAGNOSIS — I781 Nevus, non-neoplastic: Secondary | ICD-10-CM | POA: Diagnosis not present

## 2024-02-14 DIAGNOSIS — N952 Postmenopausal atrophic vaginitis: Secondary | ICD-10-CM | POA: Diagnosis not present

## 2024-02-14 DIAGNOSIS — Z0000 Encounter for general adult medical examination without abnormal findings: Secondary | ICD-10-CM | POA: Diagnosis not present

## 2024-02-14 DIAGNOSIS — M810 Age-related osteoporosis without current pathological fracture: Secondary | ICD-10-CM | POA: Diagnosis not present

## 2024-02-14 DIAGNOSIS — M545 Low back pain, unspecified: Secondary | ICD-10-CM | POA: Diagnosis not present

## 2024-02-14 DIAGNOSIS — G5701 Lesion of sciatic nerve, right lower limb: Secondary | ICD-10-CM | POA: Diagnosis not present

## 2024-02-14 DIAGNOSIS — K219 Gastro-esophageal reflux disease without esophagitis: Secondary | ICD-10-CM | POA: Diagnosis not present

## 2024-02-14 DIAGNOSIS — Z1331 Encounter for screening for depression: Secondary | ICD-10-CM | POA: Diagnosis not present

## 2024-02-14 DIAGNOSIS — M8589 Other specified disorders of bone density and structure, multiple sites: Secondary | ICD-10-CM | POA: Diagnosis not present

## 2024-02-14 DIAGNOSIS — E039 Hypothyroidism, unspecified: Secondary | ICD-10-CM | POA: Diagnosis not present

## 2024-02-14 DIAGNOSIS — Z6827 Body mass index (BMI) 27.0-27.9, adult: Secondary | ICD-10-CM | POA: Diagnosis not present

## 2024-02-28 DIAGNOSIS — M5416 Radiculopathy, lumbar region: Secondary | ICD-10-CM | POA: Diagnosis not present

## 2024-03-02 DIAGNOSIS — M5416 Radiculopathy, lumbar region: Secondary | ICD-10-CM | POA: Diagnosis not present

## 2024-03-06 DIAGNOSIS — M5416 Radiculopathy, lumbar region: Secondary | ICD-10-CM | POA: Diagnosis not present

## 2024-03-08 DIAGNOSIS — M5416 Radiculopathy, lumbar region: Secondary | ICD-10-CM | POA: Diagnosis not present

## 2024-03-20 DIAGNOSIS — M5416 Radiculopathy, lumbar region: Secondary | ICD-10-CM | POA: Diagnosis not present

## 2024-03-22 DIAGNOSIS — M5416 Radiculopathy, lumbar region: Secondary | ICD-10-CM | POA: Diagnosis not present

## 2024-03-27 DIAGNOSIS — M5416 Radiculopathy, lumbar region: Secondary | ICD-10-CM | POA: Diagnosis not present

## 2024-03-29 DIAGNOSIS — M5416 Radiculopathy, lumbar region: Secondary | ICD-10-CM | POA: Diagnosis not present

## 2024-04-03 DIAGNOSIS — M5416 Radiculopathy, lumbar region: Secondary | ICD-10-CM | POA: Diagnosis not present

## 2024-04-05 DIAGNOSIS — M5416 Radiculopathy, lumbar region: Secondary | ICD-10-CM | POA: Diagnosis not present

## 2024-04-10 DIAGNOSIS — M5416 Radiculopathy, lumbar region: Secondary | ICD-10-CM | POA: Diagnosis not present

## 2024-04-12 DIAGNOSIS — M5416 Radiculopathy, lumbar region: Secondary | ICD-10-CM | POA: Diagnosis not present

## 2024-04-24 DIAGNOSIS — M5416 Radiculopathy, lumbar region: Secondary | ICD-10-CM | POA: Diagnosis not present

## 2024-08-27 DIAGNOSIS — K219 Gastro-esophageal reflux disease without esophagitis: Secondary | ICD-10-CM | POA: Diagnosis not present

## 2024-08-27 DIAGNOSIS — Z79899 Other long term (current) drug therapy: Secondary | ICD-10-CM | POA: Diagnosis not present

## 2024-08-27 DIAGNOSIS — K760 Fatty (change of) liver, not elsewhere classified: Secondary | ICD-10-CM | POA: Diagnosis not present

## 2024-08-27 DIAGNOSIS — Z6827 Body mass index (BMI) 27.0-27.9, adult: Secondary | ICD-10-CM | POA: Diagnosis not present

## 2024-08-27 DIAGNOSIS — E039 Hypothyroidism, unspecified: Secondary | ICD-10-CM | POA: Diagnosis not present

## 2024-08-27 DIAGNOSIS — E559 Vitamin D deficiency, unspecified: Secondary | ICD-10-CM | POA: Diagnosis not present

## 2024-08-27 DIAGNOSIS — Z859 Personal history of malignant neoplasm, unspecified: Secondary | ICD-10-CM | POA: Diagnosis not present

## 2024-09-10 DIAGNOSIS — M810 Age-related osteoporosis without current pathological fracture: Secondary | ICD-10-CM | POA: Diagnosis not present

## 2024-09-10 DIAGNOSIS — Z13820 Encounter for screening for osteoporosis: Secondary | ICD-10-CM | POA: Diagnosis not present
